# Patient Record
Sex: Female | Born: 1960 | Race: White | Hispanic: No | Marital: Married | Smoking: Never smoker
Health system: Southern US, Community
[De-identification: ages and names within clinical notes are randomized; demographics above are authoritative.]

## PROBLEM LIST (undated history)

## (undated) DIAGNOSIS — I10 Essential (primary) hypertension: Secondary | ICD-10-CM

## (undated) DIAGNOSIS — K219 Gastro-esophageal reflux disease without esophagitis: Secondary | ICD-10-CM

## (undated) DIAGNOSIS — M1990 Unspecified osteoarthritis, unspecified site: Secondary | ICD-10-CM

## (undated) HISTORY — PX: CHOLECYSTECTOMY: SHX55

---

## 2005-02-06 ENCOUNTER — Ambulatory Visit: Payer: Self-pay | Admitting: Unknown Physician Specialty

## 2008-03-05 ENCOUNTER — Emergency Department: Payer: Self-pay | Admitting: Internal Medicine

## 2008-12-14 ENCOUNTER — Ambulatory Visit: Payer: Self-pay | Admitting: Family Medicine

## 2009-09-28 ENCOUNTER — Ambulatory Visit: Payer: Self-pay | Admitting: Unknown Physician Specialty

## 2009-12-10 ENCOUNTER — Emergency Department: Payer: Self-pay | Admitting: Emergency Medicine

## 2012-05-30 ENCOUNTER — Ambulatory Visit: Payer: Self-pay | Admitting: Cardiology

## 2014-01-19 DIAGNOSIS — F419 Anxiety disorder, unspecified: Secondary | ICD-10-CM | POA: Insufficient documentation

## 2014-01-19 DIAGNOSIS — F5101 Primary insomnia: Secondary | ICD-10-CM | POA: Insufficient documentation

## 2014-03-17 ENCOUNTER — Ambulatory Visit: Payer: Self-pay | Admitting: Rheumatology

## 2018-03-13 ENCOUNTER — Other Ambulatory Visit: Payer: Self-pay | Admitting: Rheumatology

## 2018-03-13 DIAGNOSIS — R0602 Shortness of breath: Secondary | ICD-10-CM

## 2018-03-13 DIAGNOSIS — R05 Cough: Secondary | ICD-10-CM

## 2018-03-13 DIAGNOSIS — R059 Cough, unspecified: Secondary | ICD-10-CM

## 2018-03-19 ENCOUNTER — Ambulatory Visit
Admission: RE | Admit: 2018-03-19 | Discharge: 2018-03-19 | Disposition: A | Discharge status: Home / Self Care | Payer: BC Managed Care – PPO | Source: Ambulatory Visit | Attending: Rheumatology | Admitting: Rheumatology

## 2018-03-19 DIAGNOSIS — R05 Cough: Secondary | ICD-10-CM | POA: Insufficient documentation

## 2018-03-19 DIAGNOSIS — R0602 Shortness of breath: Secondary | ICD-10-CM | POA: Diagnosis present

## 2018-03-19 DIAGNOSIS — R059 Cough, unspecified: Secondary | ICD-10-CM

## 2018-07-25 ENCOUNTER — Other Ambulatory Visit: Payer: Self-pay | Admitting: Obstetrics and Gynecology

## 2018-12-06 DIAGNOSIS — M8580 Other specified disorders of bone density and structure, unspecified site: Secondary | ICD-10-CM | POA: Insufficient documentation

## 2019-06-08 ENCOUNTER — Other Ambulatory Visit: Payer: Self-pay | Admitting: Family Medicine

## 2019-06-08 DIAGNOSIS — H5310 Unspecified subjective visual disturbances: Secondary | ICD-10-CM

## 2019-06-08 DIAGNOSIS — G4489 Other headache syndrome: Secondary | ICD-10-CM

## 2019-06-08 DIAGNOSIS — I10 Essential (primary) hypertension: Secondary | ICD-10-CM

## 2019-06-17 ENCOUNTER — Other Ambulatory Visit: Payer: Self-pay

## 2019-06-17 ENCOUNTER — Ambulatory Visit
Admission: RE | Admit: 2019-06-17 | Discharge: 2019-06-17 | Disposition: A | Discharge status: Home / Self Care | Payer: BC Managed Care – PPO | Source: Ambulatory Visit | Attending: Family Medicine | Admitting: Family Medicine

## 2019-06-17 DIAGNOSIS — H5310 Unspecified subjective visual disturbances: Secondary | ICD-10-CM | POA: Diagnosis present

## 2019-06-17 DIAGNOSIS — G4489 Other headache syndrome: Secondary | ICD-10-CM | POA: Diagnosis present

## 2019-06-17 DIAGNOSIS — I10 Essential (primary) hypertension: Secondary | ICD-10-CM | POA: Diagnosis present

## 2019-07-20 ENCOUNTER — Ambulatory Visit: Payer: BC Managed Care – PPO | Admitting: Dermatology

## 2019-08-14 ENCOUNTER — Other Ambulatory Visit: Payer: Self-pay | Admitting: Neurology

## 2019-08-14 ENCOUNTER — Other Ambulatory Visit (HOSPITAL_COMMUNITY): Payer: Self-pay | Admitting: Neurology

## 2019-08-14 DIAGNOSIS — R519 Headache, unspecified: Secondary | ICD-10-CM

## 2019-08-28 ENCOUNTER — Ambulatory Visit
Admission: RE | Admit: 2019-08-28 | Discharge: 2019-08-28 | Disposition: A | Discharge status: Home / Self Care | Payer: BC Managed Care – PPO | Source: Ambulatory Visit | Attending: Neurology | Admitting: Neurology

## 2019-08-28 ENCOUNTER — Other Ambulatory Visit: Payer: Self-pay

## 2019-08-28 DIAGNOSIS — R519 Headache, unspecified: Secondary | ICD-10-CM

## 2019-08-28 DIAGNOSIS — Z8673 Personal history of transient ischemic attack (TIA), and cerebral infarction without residual deficits: Secondary | ICD-10-CM | POA: Insufficient documentation

## 2019-08-28 MED ORDER — GADOBUTROL 1 MMOL/ML IV SOLN
6.0000 mL | Freq: Once | INTRAVENOUS | Status: AC | PRN
  Administered 2019-08-28: 6 mL via INTRAVENOUS

## 2019-11-09 ENCOUNTER — Ambulatory Visit: Payer: BC Managed Care – PPO | Admitting: Dermatology

## 2019-11-09 ENCOUNTER — Other Ambulatory Visit: Payer: Self-pay

## 2019-11-09 DIAGNOSIS — Z1283 Encounter for screening for malignant neoplasm of skin: Secondary | ICD-10-CM

## 2019-11-09 DIAGNOSIS — D229 Melanocytic nevi, unspecified: Secondary | ICD-10-CM | POA: Diagnosis not present

## 2019-11-09 DIAGNOSIS — L820 Inflamed seborrheic keratosis: Secondary | ICD-10-CM | POA: Diagnosis not present

## 2019-11-09 DIAGNOSIS — L814 Other melanin hyperpigmentation: Secondary | ICD-10-CM

## 2019-11-09 DIAGNOSIS — L821 Other seborrheic keratosis: Secondary | ICD-10-CM | POA: Diagnosis not present

## 2019-11-09 DIAGNOSIS — L918 Other hypertrophic disorders of the skin: Secondary | ICD-10-CM

## 2019-11-09 DIAGNOSIS — D225 Melanocytic nevi of trunk: Secondary | ICD-10-CM | POA: Diagnosis not present

## 2019-11-09 DIAGNOSIS — L578 Other skin changes due to chronic exposure to nonionizing radiation: Secondary | ICD-10-CM

## 2019-11-09 DIAGNOSIS — D1800 Hemangioma unspecified site: Secondary | ICD-10-CM

## 2019-11-09 NOTE — Patient Instructions (Addendum)
Seborrheic Keratosis  What causes seborrheic keratoses? Seborrheic keratoses are harmless, common skin growths that first appear during adult life.  As time goes by, more growths appear.  Some people may develop a large number of them.  Seborrheic keratoses appear on both covered and uncovered body parts.  They are not caused by sunlight.  The tendency to develop seborrheic keratoses can be inherited.  They vary in color from skin-colored to gray, brown, or even black.  They can be either smooth or have a rough, warty surface.   Seborrheic keratoses are superficial and look as if they were stuck on the skin.  Under the microscope this type of keratosis looks like layers upon layers of skin.  That is why at times the top layer may seem to fall off, but the rest of the growth remains and re-grows.    Treatment Seborrheic keratoses do not need to be treated, but can easily be removed in the office.  Seborrheic keratoses often cause symptoms when they rub on clothing or jewelry.  Lesions can be in the way of shaving.  If they become inflamed, they can cause itching, soreness, or burning.  Removal of a seborrheic keratosis can be accomplished by freezing, burning, or surgery. If any spot bleeds, scabs, or grows rapidly, please return to have it checked, as these can be an indication of a skin cancer.   Cryotherapy Aftercare  Wash gently with soap and water everyday.   Apply Vaseline and Band-Aid daily until healed.  

## 2019-11-09 NOTE — Progress Notes (Signed)
   New Patient Visit  Subjective  Karen Cummings is a 59 y.o. female who presents for the following: Annual Exam (Total body skin exam, no hx of skin ca) and growth (abdomen, growing).  Also new spots on R breast that she is concerned about.  Itchy spot on R buttock.   The following portions of the chart were reviewed this encounter and updated as appropriate:      Review of Systems:  No other skin or systemic complaints except as noted in HPI or Assessment and Plan.  Objective  Well appearing patient in no apparent distress; mood and affect are within normal limits.  A full examination was performed including scalp, head, eyes, ears, nose, lips, neck, chest, axillae, abdomen, back, buttocks, bilateral upper extremities, bilateral lower extremities, hands, feet, fingers, toes, fingernails, and toenails. All findings within normal limits unless otherwise noted below.  Objective  Right breast areola x 3, R lower abdomen x 1, R buttock x 1 (5): Erythematous keratotic or waxy stuck-on papule    Objective  Lower back: Scattered medium dark brown macules ~2.16mm   Assessment & Plan    Seborrheic Keratoses - Stuck-on, waxy, tan-brown papules and plaques  - Discussed benign etiology and prognosis. - Observe - Call for any changes  Inflamed seborrheic keratosis (5) Right breast areola x 3, R lower abdomen x 1, R buttock x 1  Destruction of lesion - Right breast areola x 3, R lower abdomen x 1, R buttock x 1  Destruction method: cryotherapy   Destruction method comment:  Electrodessication Informed consent: discussed and consent obtained   Lesion destroyed using liquid nitrogen: Yes   Region frozen until ice ball extended beyond lesion: Yes   Outcome: patient tolerated procedure well with no complications   Post-procedure details: wound care instructions given   Additional details:  Prior to procedure, discussed risks of blister formation, small wound, skin dyspigmentation, or  rare scar following cryotherapy.  Nevus Lower back  Benign-appearing.  Observation.  Call clinic for new or changing lesions.  Recommend daily use of broad spectrum spf 30+ sunscreen to sun-exposed areas.     Lentigines - Scattered tan macules - Discussed due to sun exposure - Benign, observe - Call for any changes  Hemangiomas - Red papules - Discussed benign nature - Observe - Call for any changes  Skin cancer screening performed today.  Actinic Damage - diffuse scaly erythematous macules with underlying dyspigmentation - Recommend daily broad spectrum sunscreen SPF 30+ to sun-exposed areas, reapply every 2 hours as needed.  - Call for new or changing lesions.  Melanocytic Nevi - Tan-brown and/or pink-flesh-colored symmetric macules and papules - Benign appearing on exam today - Observation - Call clinic for new or changing moles - Recommend daily use of broad spectrum spf 30+ sunscreen to sun-exposed areas.   Acrochordons (Skin Tags) - Fleshy, skin-colored pedunculated papules - Benign appearing.  - Observe. - If desired, they can be removed with an in office procedure that is not covered by insurance. - Please call the clinic if you notice any new or changing lesions.  Return in about 1 year (around 11/08/2020) for TBSE.   I, Othelia Pulling, RMA, am acting as scribe for Brendolyn Patty, MD . Documentation: I have reviewed the above documentation for accuracy and completeness, and I agree with the above.  Brendolyn Patty MD

## 2020-03-30 ENCOUNTER — Other Ambulatory Visit: Payer: Self-pay | Admitting: Rheumatology

## 2020-03-30 DIAGNOSIS — M25511 Pain in right shoulder: Secondary | ICD-10-CM

## 2020-04-06 ENCOUNTER — Ambulatory Visit: Payer: BC Managed Care – PPO

## 2020-04-10 ENCOUNTER — Ambulatory Visit
Admission: RE | Admit: 2020-04-10 | Discharge: 2020-04-10 | Disposition: A | Discharge status: Home / Self Care | Payer: BC Managed Care – PPO | Source: Ambulatory Visit | Attending: Rheumatology | Admitting: Rheumatology

## 2020-04-10 ENCOUNTER — Other Ambulatory Visit: Payer: Self-pay

## 2020-04-10 DIAGNOSIS — M25511 Pain in right shoulder: Secondary | ICD-10-CM | POA: Diagnosis not present

## 2020-05-19 ENCOUNTER — Other Ambulatory Visit: Payer: Self-pay | Admitting: Surgery

## 2020-06-01 ENCOUNTER — Other Ambulatory Visit: Payer: BC Managed Care – PPO

## 2020-06-01 ENCOUNTER — Encounter
Admission: RE | Admit: 2020-06-01 | Discharge: 2020-06-01 | Disposition: A | Discharge status: Home / Self Care | Payer: BC Managed Care – PPO | Source: Ambulatory Visit | Attending: Surgery | Admitting: Surgery

## 2020-06-01 ENCOUNTER — Other Ambulatory Visit: Payer: Self-pay

## 2020-06-01 HISTORY — DX: Essential (primary) hypertension: I10

## 2020-06-01 HISTORY — DX: Unspecified osteoarthritis, unspecified site: M19.90

## 2020-06-01 HISTORY — DX: Gastro-esophageal reflux disease without esophagitis: K21.9

## 2020-06-01 NOTE — Patient Instructions (Signed)
Your procedure is scheduled on: 06/09/20 Report to [facility]. To find out your arrival time please call [phone] between 1PM - 3PM on 06/08/20.  Remember: Instructions that are not followed completely may result in serious medical risk, up to and including death, or upon the discretion of your surgeon and anesthesiologist your surgery may need to be rescheduled.     _X__ 1. Do not eat food after midnight the night before your procedure.                 No gum chewing or hard candies. You may drink clear liquids up to 2 hours                 before you are scheduled to arrive for your surgery- DO not drink clear                 liquids within 2 hours of the start of your surgery.                 Clear Liquids include:  water, apple juice without pulp, clear carbohydrate                 drink such as Clearfast or Gatorade, Black Coffee or Tea (Do not add                 anything to coffee or tea). Diabetics water only  __X__2.  On the morning of surgery brush your teeth with toothpaste and water, you                 may rinse your mouth with mouthwash if you wish.  Do not swallow any              toothpaste of mouthwash.     _X__ 3.  No Alcohol for 24 hours before or after surgery.   _X__ 4.  Do Not Smoke or use e-cigarettes For 24 Hours Prior to Your Surgery.                 Do not use any chewable tobacco products for at least 6 hours prior to                 surgery.  ____  5.  Bring all medications with you on the day of surgery if instructed.   __X__  6.  Notify your doctor if there is any change in your medical condition      (cold, fever, infections).     Do not wear jewelry, make-up, hairpins, clips or nail polish. Do not wear lotions, powders, or perfumes.  Do not shave 48 hours prior to surgery. Men may shave face and neck. Do not bring valuables to the hospital.    [facility] is not responsible for any belongings or  valuables.  Contacts, dentures/partials or body piercings may not be worn into surgery. Bring a case for your contacts, glasses or hearing aids, a denture cup will be supplied. Leave your suitcase in the car. After surgery it may be brought to your room. For patients admitted to the hospital, discharge time is determined by your treatment team.   Patients discharged the day of surgery will not be allowed to drive home.   Please read over the following fact sheets that you were given:   MRSA Information  __X__ Take these medicines the morning of surgery with A SIP OF WATER:  1. citalopram (CELEXA) 10 MG tablet  2. omeprazole (PRILOSEC) 20 MG capsule  3.   4.  5.  6.  ____ Fleet Enema (as directed)   __X__ Use CHG Soap/SAGE wipes as directed  __X__ Use inhalers on the day of surgery  ____ Stop metformin/Janumet/Farxiga 2 days prior to surgery    ____ Take 1/2 of usual insulin dose the night before surgery. No insulin the morning          of surgery.   ____ Stop Blood Thinners Coumadin/Plavix/Xarelto/Pleta/Pradaxa/Eliquis/Effient/Aspirin  on   Or contact your Surgeon, Cardiologist or Medical Doctor regarding  ability to stop your blood thinners  __X__ Stop Anti-inflammatories 7 days before surgery such as Advil, Ibuprofen, Motrin,  BC or Goodies Powder, Naprosyn, Naproxen, Aleve, Aspirin    __X__ Stop all herbal supplements, fish oil or vitamin E until after surgery.    ____ Bring C-Pap to the hospital.

## 2020-06-02 ENCOUNTER — Encounter
Admission: RE | Admit: 2020-06-02 | Discharge: 2020-06-02 | Disposition: A | Discharge status: Home / Self Care | Payer: BC Managed Care – PPO | Source: Ambulatory Visit | Attending: Surgery | Admitting: Surgery

## 2020-06-02 DIAGNOSIS — Z01810 Encounter for preprocedural cardiovascular examination: Secondary | ICD-10-CM | POA: Insufficient documentation

## 2020-06-07 ENCOUNTER — Other Ambulatory Visit
Admission: RE | Admit: 2020-06-07 | Discharge: 2020-06-07 | Disposition: A | Discharge status: Home / Self Care | Payer: BC Managed Care – PPO | Source: Ambulatory Visit | Attending: Surgery | Admitting: Surgery

## 2020-06-07 ENCOUNTER — Other Ambulatory Visit: Payer: Self-pay

## 2020-06-07 DIAGNOSIS — Z20822 Contact with and (suspected) exposure to covid-19: Secondary | ICD-10-CM | POA: Insufficient documentation

## 2020-06-07 DIAGNOSIS — X58XXXA Exposure to other specified factors, initial encounter: Secondary | ICD-10-CM | POA: Diagnosis not present

## 2020-06-07 DIAGNOSIS — Z79899 Other long term (current) drug therapy: Secondary | ICD-10-CM | POA: Diagnosis not present

## 2020-06-07 DIAGNOSIS — Z79890 Hormone replacement therapy (postmenopausal): Secondary | ICD-10-CM | POA: Diagnosis not present

## 2020-06-07 DIAGNOSIS — Z01812 Encounter for preprocedural laboratory examination: Secondary | ICD-10-CM | POA: Insufficient documentation

## 2020-06-07 DIAGNOSIS — S0990XA Unspecified injury of head, initial encounter: Secondary | ICD-10-CM | POA: Diagnosis not present

## 2020-06-07 DIAGNOSIS — M75121 Complete rotator cuff tear or rupture of right shoulder, not specified as traumatic: Secondary | ICD-10-CM | POA: Diagnosis not present

## 2020-06-07 LAB — SARS CORONAVIRUS 2 (TAT 6-24 HRS): SARS Coronavirus 2: NEGATIVE

## 2020-06-08 MED ORDER — LACTATED RINGERS IV SOLN: INTRAVENOUS | Status: DC

## 2020-06-08 MED ORDER — CHLORHEXIDINE GLUCONATE 0.12 % MT SOLN: 15.0000 mL | Freq: Once | OROMUCOSAL | Status: AC

## 2020-06-08 MED ORDER — CEFAZOLIN SODIUM-DEXTROSE 2-4 GM/100ML-% IV SOLN
2.0000 g | INTRAVENOUS | Status: AC
  Administered 2020-06-09: 2 g via INTRAVENOUS

## 2020-06-08 MED ORDER — ORAL CARE MOUTH RINSE: 15.0000 mL | Freq: Once | OROMUCOSAL | Status: AC

## 2020-06-09 ENCOUNTER — Ambulatory Visit: Payer: BC Managed Care – PPO

## 2020-06-09 ENCOUNTER — Ambulatory Visit
Admission: RE | Admit: 2020-06-09 | Discharge: 2020-06-09 | Disposition: A | Discharge status: Home / Self Care | Payer: BC Managed Care – PPO | Attending: Surgery | Admitting: Surgery

## 2020-06-09 ENCOUNTER — Encounter
Admission: RE | Disposition: A | Discharge status: Home / Self Care | Payer: Self-pay | Source: Home / Self Care | Attending: Surgery

## 2020-06-09 ENCOUNTER — Ambulatory Visit: Payer: BC Managed Care – PPO | Admitting: Anesthesiology

## 2020-06-09 ENCOUNTER — Encounter: Payer: Self-pay | Admitting: Surgery

## 2020-06-09 ENCOUNTER — Other Ambulatory Visit: Payer: Self-pay

## 2020-06-09 DIAGNOSIS — Z20822 Contact with and (suspected) exposure to covid-19: Secondary | ICD-10-CM | POA: Insufficient documentation

## 2020-06-09 DIAGNOSIS — M75121 Complete rotator cuff tear or rupture of right shoulder, not specified as traumatic: Secondary | ICD-10-CM | POA: Insufficient documentation

## 2020-06-09 DIAGNOSIS — X58XXXA Exposure to other specified factors, initial encounter: Secondary | ICD-10-CM | POA: Insufficient documentation

## 2020-06-09 DIAGNOSIS — Z79890 Hormone replacement therapy (postmenopausal): Secondary | ICD-10-CM | POA: Insufficient documentation

## 2020-06-09 DIAGNOSIS — Z79899 Other long term (current) drug therapy: Secondary | ICD-10-CM | POA: Insufficient documentation

## 2020-06-09 DIAGNOSIS — S0990XA Unspecified injury of head, initial encounter: Secondary | ICD-10-CM | POA: Insufficient documentation

## 2020-06-09 DIAGNOSIS — M25511 Pain in right shoulder: Secondary | ICD-10-CM

## 2020-06-09 HISTORY — PX: SHOULDER ARTHROSCOPY WITH SUBACROMIAL DECOMPRESSION, ROTATOR CUFF REPAIR AND BICEP TENDON REPAIR: SHX5687

## 2020-06-09 SURGERY — SHOULDER ARTHROSCOPY WITH SUBACROMIAL DECOMPRESSION, ROTATOR CUFF REPAIR AND BICEP TENDON REPAIR
Anesthesia: General | Laterality: Right

## 2020-06-09 MED ORDER — BUPIVACAINE-EPINEPHRINE 0.5% -1:200000 IJ SOLN
INTRAMUSCULAR | Status: DC | PRN
  Administered 2020-06-09: 30 mL

## 2020-06-09 MED ORDER — HYDROCODONE-ACETAMINOPHEN 7.5-325 MG PO TABS: 1.0000 | ORAL_TABLET | Freq: Once | ORAL | Status: DC | PRN

## 2020-06-09 MED ORDER — EPHEDRINE SULFATE 50 MG/ML IJ SOLN
INTRAMUSCULAR | Status: DC | PRN
  Administered 2020-06-09: 10 mg via INTRAVENOUS

## 2020-06-09 MED ORDER — ACETAMINOPHEN 10 MG/ML IV SOLN
INTRAVENOUS | Status: AC
  Filled 2020-06-09: qty 100

## 2020-06-09 MED ORDER — CEFAZOLIN SODIUM-DEXTROSE 2-4 GM/100ML-% IV SOLN
INTRAVENOUS | Status: AC
  Filled 2020-06-09: qty 100

## 2020-06-09 MED ORDER — CHLORHEXIDINE GLUCONATE 0.12 % MT SOLN
OROMUCOSAL | Status: AC
  Administered 2020-06-09: 15 mL via OROMUCOSAL
  Filled 2020-06-09: qty 15

## 2020-06-09 MED ORDER — LIDOCAINE HCL (CARDIAC) PF 100 MG/5ML IV SOSY
PREFILLED_SYRINGE | INTRAVENOUS | Status: DC | PRN
  Administered 2020-06-09: 100 mg via INTRAVENOUS

## 2020-06-09 MED ORDER — FENTANYL CITRATE (PF) 100 MCG/2ML IJ SOLN
INTRAMUSCULAR | Status: AC
  Filled 2020-06-09: qty 2

## 2020-06-09 MED ORDER — MIDAZOLAM HCL 2 MG/2ML IJ SOLN
1.0000 mg | Freq: Once | INTRAMUSCULAR | Status: AC
  Administered 2020-06-09: 1 mg via INTRAVENOUS

## 2020-06-09 MED ORDER — BUPIVACAINE-EPINEPHRINE (PF) 0.5% -1:200000 IJ SOLN
INTRAMUSCULAR | Status: AC
  Filled 2020-06-09: qty 30

## 2020-06-09 MED ORDER — PROPOFOL 500 MG/50ML IV EMUL
INTRAVENOUS | Status: AC
  Filled 2020-06-09: qty 50

## 2020-06-09 MED ORDER — MIDAZOLAM HCL 2 MG/2ML IJ SOLN
INTRAMUSCULAR | Status: DC | PRN
  Administered 2020-06-09 (×2): 1 mg via INTRAVENOUS

## 2020-06-09 MED ORDER — FENTANYL CITRATE (PF) 100 MCG/2ML IJ SOLN: 50.0000 ug | Freq: Once | INTRAMUSCULAR | Status: AC

## 2020-06-09 MED ORDER — BUPIVACAINE LIPOSOME 1.3 % IJ SUSP
INTRAMUSCULAR | Status: DC | PRN
  Administered 2020-06-09: 15 mL via PERINEURAL

## 2020-06-09 MED ORDER — LACTATED RINGERS IV SOLN: INTRAVENOUS | Status: DC | PRN

## 2020-06-09 MED ORDER — SUGAMMADEX SODIUM 500 MG/5ML IV SOLN
INTRAVENOUS | Status: DC | PRN
  Administered 2020-06-09 (×2): 200 mg via INTRAVENOUS

## 2020-06-09 MED ORDER — MIDAZOLAM HCL 2 MG/2ML IJ SOLN
INTRAMUSCULAR | Status: AC
  Filled 2020-06-09: qty 2

## 2020-06-09 MED ORDER — BUPIVACAINE LIPOSOME 1.3 % IJ SUSP
INTRAMUSCULAR | Status: AC
  Filled 2020-06-09: qty 20

## 2020-06-09 MED ORDER — OXYCODONE HCL 5 MG PO TABS: 5.0000 mg | ORAL_TABLET | ORAL | 0 refills | Status: DC | PRN

## 2020-06-09 MED ORDER — FENTANYL CITRATE (PF) 100 MCG/2ML IJ SOLN
INTRAMUSCULAR | Status: DC | PRN
  Administered 2020-06-09 (×2): 50 ug via INTRAVENOUS

## 2020-06-09 MED ORDER — EPHEDRINE 5 MG/ML INJ
INTRAVENOUS | Status: AC
  Filled 2020-06-09: qty 10

## 2020-06-09 MED ORDER — ACETAMINOPHEN 325 MG PO TABS
325.0000 mg | ORAL_TABLET | ORAL | Status: DC | PRN
Start: 2020-06-09 — End: 2020-06-09

## 2020-06-09 MED ORDER — PROMETHAZINE HCL 25 MG/ML IJ SOLN: 6.2500 mg | INTRAMUSCULAR | Status: DC | PRN

## 2020-06-09 MED ORDER — PROPOFOL 10 MG/ML IV BOLUS
INTRAVENOUS | Status: DC | PRN
  Administered 2020-06-09: 180 mg via INTRAVENOUS

## 2020-06-09 MED ORDER — FENTANYL CITRATE (PF) 100 MCG/2ML IJ SOLN
50.0000 ug | Freq: Once | INTRAMUSCULAR | Status: AC
  Administered 2020-06-09: 50 ug via INTRAVENOUS

## 2020-06-09 MED ORDER — PHENYLEPHRINE HCL (PRESSORS) 10 MG/ML IV SOLN
INTRAVENOUS | Status: DC | PRN
  Administered 2020-06-09 (×3): 100 ug via INTRAVENOUS

## 2020-06-09 MED ORDER — BUPIVACAINE HCL (PF) 0.5 % IJ SOLN
INTRAMUSCULAR | Status: AC
  Filled 2020-06-09: qty 10

## 2020-06-09 MED ORDER — DROPERIDOL 2.5 MG/ML IJ SOLN
0.6250 mg | Freq: Once | INTRAMUSCULAR | Status: DC | PRN
  Filled 2020-06-09: qty 2

## 2020-06-09 MED ORDER — ACETAMINOPHEN 10 MG/ML IV SOLN
INTRAVENOUS | Status: DC | PRN
  Administered 2020-06-09: 1000 mg via INTRAVENOUS

## 2020-06-09 MED ORDER — SODIUM CHLORIDE 0.9 % IV SOLN
INTRAVENOUS | Status: DC | PRN
  Administered 2020-06-09: 50 ug/min via INTRAVENOUS

## 2020-06-09 MED ORDER — FENTANYL CITRATE (PF) 100 MCG/2ML IJ SOLN
INTRAMUSCULAR | Status: AC
  Administered 2020-06-09: 50 ug via INTRAVENOUS
  Filled 2020-06-09: qty 2

## 2020-06-09 MED ORDER — ROCURONIUM BROMIDE 100 MG/10ML IV SOLN
INTRAVENOUS | Status: DC | PRN
  Administered 2020-06-09: 60 mg via INTRAVENOUS

## 2020-06-09 MED ORDER — DEXAMETHASONE SODIUM PHOSPHATE 10 MG/ML IJ SOLN
INTRAMUSCULAR | Status: DC | PRN
  Administered 2020-06-09: 4 mg via INTRAVENOUS

## 2020-06-09 MED ORDER — BUPIVACAINE HCL (PF) 0.5 % IJ SOLN
INTRAMUSCULAR | Status: DC | PRN
  Administered 2020-06-09: 5 mL via PERINEURAL

## 2020-06-09 MED ORDER — MIDAZOLAM HCL 2 MG/2ML IJ SOLN: 1.0000 mg | Freq: Once | INTRAMUSCULAR | Status: AC

## 2020-06-09 MED ORDER — ONDANSETRON HCL 4 MG/2ML IJ SOLN
INTRAMUSCULAR | Status: DC | PRN
  Administered 2020-06-09: 4 mg via INTRAVENOUS

## 2020-06-09 MED ORDER — ACETAMINOPHEN 160 MG/5ML PO SOLN
325.0000 mg | ORAL | Status: DC | PRN
  Filled 2020-06-09: qty 20.3

## 2020-06-09 MED ORDER — MIDAZOLAM HCL 2 MG/2ML IJ SOLN
INTRAMUSCULAR | Status: AC
  Administered 2020-06-09: 1 mg via INTRAVENOUS
  Filled 2020-06-09: qty 2

## 2020-06-09 SURGICAL SUPPLY — 52 items
ANCH SUT 2 2.9 2 LD TPR NDL (Anchor) ×3 IMPLANT
ANCH SUT 5.5 KNTLS (Anchor) ×2 IMPLANT
ANCHOR HEALICOIL REGEN 5.5 (Anchor) ×4 IMPLANT
ANCHOR JUGGERKNOT WTAP NDL 2.9 (Anchor) ×6 IMPLANT
ANCHOR QUATTRO KNOTLESS 4.5MM (Anchor) IMPLANT
ANCHOR SUT W/ ORTHOCORD (Anchor) IMPLANT
APL PRP STRL LF DISP 70% ISPRP (MISCELLANEOUS) ×1
BIT DRILL JUGRKNT W/NDL BIT2.9 (DRILL) ×1 IMPLANT
BLADE FULL RADIUS 3.5 (BLADE) ×2 IMPLANT
BUR ACROMIONIZER 4.0 (BURR) ×2 IMPLANT
CANNULA SHAVER 8MMX76MM (CANNULA) ×2 IMPLANT
CHLORAPREP W/TINT 26 (MISCELLANEOUS) ×2 IMPLANT
COVER MAYO STAND REUSABLE (DRAPES) ×2 IMPLANT
COVER WAND RF STERILE (DRAPES) ×2 IMPLANT
DILATOR 5.5 THREADED HEALICOIL (MISCELLANEOUS) ×2 IMPLANT
DRAPE IMP U-DRAPE 54X76 (DRAPES) ×4 IMPLANT
DRILL JUGGERKNOT W/NDL BIT 2.9 (DRILL) ×2
ELECT CAUTERY BLADE 6.4 (BLADE) ×2 IMPLANT
ELECT REM PT RETURN 9FT ADLT (ELECTROSURGICAL) ×2
ELECTRODE REM PT RTRN 9FT ADLT (ELECTROSURGICAL) ×1 IMPLANT
GAUZE SPONGE 4X4 12PLY STRL (GAUZE/BANDAGES/DRESSINGS) ×2 IMPLANT
GAUZE XEROFORM 1X8 LF (GAUZE/BANDAGES/DRESSINGS) ×2 IMPLANT
GLOVE SRG 8 PF TXTR STRL LF DI (GLOVE) ×1 IMPLANT
GLOVE SURG ENC MOIS LTX SZ7.5 (GLOVE) ×4 IMPLANT
GLOVE SURG ENC MOIS LTX SZ8 (GLOVE) ×4 IMPLANT
GLOVE SURG UNDER LTX SZ8 (GLOVE) ×2 IMPLANT
GLOVE SURG UNDER POLY LF SZ8 (GLOVE) ×2
GOWN STRL REUS W/ TWL LRG LVL3 (GOWN DISPOSABLE) ×1 IMPLANT
GOWN STRL REUS W/ TWL XL LVL3 (GOWN DISPOSABLE) ×1 IMPLANT
GOWN STRL REUS W/TWL LRG LVL3 (GOWN DISPOSABLE) ×2
GOWN STRL REUS W/TWL XL LVL3 (GOWN DISPOSABLE) ×2
GRASPER SUT 15 45D LOW PRO (SUTURE) IMPLANT
IV LACTATED RINGER IRRG 3000ML (IV SOLUTION) ×4
IV LR IRRIG 3000ML ARTHROMATIC (IV SOLUTION) ×2 IMPLANT
KIT CANNULA 8X76-LX IN CANNULA (CANNULA) IMPLANT
MANIFOLD NEPTUNE II (INSTRUMENTS) ×4 IMPLANT
MASK FACE SPIDER DISP (MASK) ×2 IMPLANT
MAT ABSORB  FLUID 56X50 GRAY (MISCELLANEOUS) ×1
MAT ABSORB FLUID 56X50 GRAY (MISCELLANEOUS) ×1 IMPLANT
PACK ARTHROSCOPY SHOULDER (MISCELLANEOUS) ×2 IMPLANT
PAD ABD DERMACEA PRESS 5X9 (GAUZE/BANDAGES/DRESSINGS) ×2 IMPLANT
PASSER SUT FIRSTPASS SELF (INSTRUMENTS) IMPLANT
SLING ULTRA II LG (MISCELLANEOUS) ×2 IMPLANT
STAPLER SKIN PROX 35W (STAPLE) ×2 IMPLANT
STRAP SAFETY 5IN WIDE (MISCELLANEOUS) ×2 IMPLANT
SUT ETHIBOND 0 MO6 C/R (SUTURE) ×2 IMPLANT
SUT ULTRABRAID 2 COBRAID 38 (SUTURE) IMPLANT
SUT VIC AB 2-0 CT1 27 (SUTURE) ×4
SUT VIC AB 2-0 CT1 TAPERPNT 27 (SUTURE) ×2 IMPLANT
TAPE MICROFOAM 4IN (TAPE) ×2 IMPLANT
TUBING ARTHRO INFLOW-ONLY STRL (TUBING) ×2 IMPLANT
WAND WEREWOLF FLOW 90D (MISCELLANEOUS) ×2 IMPLANT

## 2020-06-09 NOTE — Discharge Instructions (Addendum)
AMBULATORY SURGERY  DISCHARGE INSTRUCTIONS   1) The drugs that you were given will stay in your system until tomorrow so for the next 24 hours you should not:  A) Drive an automobile B) Make any legal decisions C) Drink any alcoholic beverage   2) You may resume regular meals tomorrow.  Today it is better to start with liquids and gradually work up to solid foods.  You may eat anything you prefer, but it is better to start with liquids, then soup and crackers, and gradually work up to solid foods.   3) Please notify your doctor immediately if you have any unusual bleeding, trouble breathing, redness and pain at the surgery site, drainage, fever, or pain not relieved by medication.    4) Additional Instructions:  Please contact your physician with any problems or Same Day Surgery at [phone], Monday through Friday 6 am to 4 pm, or [facility] number at [phone].Orthopedic discharge instructions: Keep dressing dry and intact.  May shower after dressing changed on post-op day #4 (Monday).  Cover staples with Band-Aids after drying off. Apply ice frequently to shoulder. Take ibuprofen 800 mg TID with meals for 7-10 days, then as necessary. Take oxycodone as prescribed when needed.  May supplement with ES Tylenol if necessary. Keep shoulder immobilizer on at all times except may remove for bathing purposes. Follow-up in 10-14 days or as scheduled.

## 2020-06-09 NOTE — Anesthesia Postprocedure Evaluation (Signed)
Anesthesia Post Note  Patient: Shanieka Blea  Procedure(s) Performed: RIGHT SHOULDER ARTHROSCOPY WITH DEBRIDEMENT, DECOMPRESSION, ROTATOR CUFF REPAIR, AND  BICEPS TENODESIS (Right )  Patient location during evaluation: PACU Anesthesia Type: General Level of consciousness: awake and alert Pain management: pain level controlled Vital Signs Assessment: post-procedure vital signs reviewed and stable Respiratory status: spontaneous breathing and respiratory function stable Cardiovascular status: stable Anesthetic complications: no   No complications documented.   Last Vitals:  Vitals:   06/09/20 1645 06/09/20 1700  BP: 118/70 122/71  Pulse: 79 89  Resp: 18 20  Temp:    SpO2: 94% 92%    Last Pain:  Vitals:   06/09/20 1700  TempSrc:   PainSc: 0-No pain                 Rockland Kotarski K

## 2020-06-09 NOTE — Transfer of Care (Signed)
Immediate Anesthesia Transfer of Care Note  Patient: Karen Cummings  Procedure(s) Performed: RIGHT SHOULDER ARTHROSCOPY WITH DEBRIDEMENT, DECOMPRESSION, ROTATOR CUFF REPAIR, AND  BICEPS TENODESIS (Right )  Patient Location: PACU  Anesthesia Type:General  Level of Consciousness: sedated  Airway & Oxygen Therapy: Patient Spontanous Breathing and Patient connected to face mask oxygen  Post-op Assessment: Report given to RN and Post -op Vital signs reviewed and stable  Post vital signs: Reviewed and stable  Last Vitals:  Vitals Value Taken Time  BP 111/87 06/09/20 1630  Temp 36.2 C 06/09/20 1630  Pulse 70 06/09/20 1631  Resp 16 06/09/20 1631  SpO2 100 % 06/09/20 1631  Vitals shown include unvalidated device data.  Last Pain:  Vitals:   06/09/20 1153  TempSrc: Tympanic  PainSc: 0-No pain      Patients Stated Pain Goal: 0 (58/68/25 7493)  Complications: No complications documented.

## 2020-06-09 NOTE — Addendum Note (Signed)
Addendum  created 06/09/20 1721 by Jelena Malicoat K, MD   Attestation recorded in Intraprocedure, Intraprocedure Attestations deleted, Intraprocedure Attestations filed    

## 2020-06-09 NOTE — Anesthesia Preprocedure Evaluation (Signed)
Anesthesia Evaluation  Patient identified by MRN, date of birth, ID band Patient awake    Reviewed: Allergy & Precautions, H&P , NPO status , reviewed documented beta blocker date and time   Airway Mallampati: II  TM Distance: >3 FB Neck ROM: full    Dental  (+) Caps, Teeth Intact   Pulmonary    Pulmonary exam normal        Cardiovascular hypertension, Normal cardiovascular exam     Neuro/Psych    GI/Hepatic GERD  Controlled,  Endo/Other    Renal/GU      Musculoskeletal  (+) Arthritis ,   Abdominal   Peds  Hematology   Anesthesia Other Findings Past Medical History: No date: Arthritis No date: GERD (gastroesophageal reflux disease) No date: Hypertension Past Surgical History: No date: CESAREAN SECTION     Comment:  x 3 No date: CHOLECYSTECTOMY BMI    Body Mass Index: 26.56 kg/m     Reproductive/Obstetrics                             Anesthesia Physical Anesthesia Plan  ASA: II  Anesthesia Plan: General ETT   Post-op Pain Management:  Regional for Post-op pain   Induction: Intravenous  PONV Risk Score and Plan: 3 and Ondansetron, Treatment may vary due to age or medical condition, Midazolam and Dexamethasone  Airway Management Planned: Oral ETT  Additional Equipment:   Intra-op Plan:   Post-operative Plan: Extubation in OR  Informed Consent: I have reviewed the patients History and Physical, chart, labs and discussed the procedure including the risks, benefits and alternatives for the proposed anesthesia with the patient or authorized representative who has indicated his/her understanding and acceptance.     Dental Advisory Given  Plan Discussed with: CRNA  Anesthesia Plan Comments: (Interscalene block discussed and accepted)        Anesthesia Quick Evaluation

## 2020-06-09 NOTE — Anesthesia Procedure Notes (Signed)
Procedure Name: Intubation Date/Time: 06/09/2020 2:29 PM Performed by: Justus Memory, CRNA Pre-anesthesia Checklist: Patient identified, Patient being monitored, Timeout performed, Emergency Drugs available and Suction available Patient Re-evaluated:Patient Re-evaluated prior to induction Oxygen Delivery Method: Circle system utilized Preoxygenation: Pre-oxygenation with 100% oxygen Induction Type: IV induction Ventilation: Mask ventilation without difficulty Laryngoscope Size: Mac, 3 and McGraph Grade View: Grade I Tube type: Oral Tube size: 7.0 mm Number of attempts: 1 Airway Equipment and Method: Stylet and Video-laryngoscopy Placement Confirmation: ETT inserted through vocal cords under direct vision,  positive ETCO2 and breath sounds checked- equal and bilateral Secured at: 21 cm Tube secured with: Tape Dental Injury: Teeth and Oropharynx as per pre-operative assessment  Difficulty Due To: Difficulty was anticipated and Difficult Airway- due to anterior larynx Future Recommendations: Recommend- induction with short-acting agent, and alternative techniques readily available

## 2020-06-09 NOTE — Op Note (Signed)
06/09/2020  3:55 PM  Patient:   Karen Cummings  Pre-Op Diagnosis:   Impingement/tendinopathy with full-thickness rotator cuff tear and biceps tendinopathy, right shoulder.  Post-Op Diagnosis:   Impingement/tendinopathy with near full-thickness rotator cuff tear labral fraying, and biceps tendinopathy, right shoulder.  Procedure:   Limited arthroscopic debridement, arthroscopic subacromial decompression, mini-open rotator cuff repair, and mini-open biceps tenodesis, right shoulder.  Anesthesia:   General endotracheal with interscalene block using Exparel placed preoperatively by the anesthesiologist.  Surgeon:   Pascal Lux, MD  Assistant:   Cameron Proud, PA-C  Findings:   As above. There was moderate labral fraying anteriorly, superiorly, and posteriorly without frank detachment from the glenoid rim. There was a near full-thickness articular sided bursal surface tear involving the supraspinatus tendon insertional fibers. The remainder of the rotator cuff was in excellent condition. The biceps tendon demonstrated an area of "lip sticking" without partial or full-thickness tearing. There were grade 1-2 chondromalacial changes involving the central portion of the glenoid. The humeral head articular surface was in excellent condition.  Complications:   None  Fluids:   700 cc  Estimated blood loss:   10 cc  Tourniquet time:   None  Drains:   None  Closure:   Staples      Brief clinical note:   The patient is a 60 year old female with a history of gradually worsening right shoulder pain. The patient's symptoms have progressed despite medications, activity modification, etc. The patient's history and examination are consistent with impingement/tendinopathy with a rotator cuff tear. These findings were confirmed by MRI scan. The patient presents at this time for definitive management of these shoulder symptoms.  Procedure:   The patient underwent placement of an interscalene block  using Exparel by the anesthesiologist in the preoperative holding area before being brought into the operating room and lain in the supine position. The patient then underwent general endotracheal intubation and anesthesia before being repositioned in the beach chair position using the beach chair positioner. The right shoulder and upper extremity were prepped with ChloraPrep solution before being draped sterilely. Preoperative antibiotics were administered. A timeout was performed to confirm the proper surgical site before the expected portal sites and incision site were injected with 0.5% Sensorcaine with epinephrine.   A posterior portal was created and the glenohumeral joint thoroughly inspected with the findings as described above. An anterior portal was created using an outside-in technique. The labrum and rotator cuff were further probed, again confirming the above-noted findings. The areas of labral fraying were debrided back to stable margins using the full-radius resector. The ArthroCare wand was inserted and used to release the biceps tendon from its labral anchor. It also was used to obtain hemostasis as well as to "anneal" the labrum superiorly and anteriorly. The instruments were removed from the joint after suctioning the excess fluid.  The camera was repositioned through the posterior portal into the subacromial space. A separate lateral portal was created using an outside-in technique. The 3.5 mm full-radius resector was introduced and used to perform a subtotal bursectomy. The ArthroCare wand was then inserted and used to remove the periosteal tissue off the undersurface of the anterior third of the acromion as well as to recess the coracoacromial ligament from its attachment along the anterior and lateral margins of the acromion. The 4.0 mm acromionizing bur was introduced and used to complete the decompression by removing the undersurface of the anterior third of the acromion. The full radius  resector was reintroduced to  remove any residual bony debris before the ArthroCare wand was reintroduced to obtain hemostasis. The instruments were then removed from the subacromial space after suctioning the excess fluid.  An approximately 4-5 cm incision was made over the anterolateral aspect of the shoulder beginning at the anterolateral corner of the acromion and extending distally in line with the bicipital groove. This incision was carried down through the subcutaneous tissues to expose the deltoid fascia. The raphae between the anterior and middle thirds was identified and this plane developed to provide access into the subacromial space. Additional bursal tissues were debrided sharply using Metzenbaum scissors. The rotator cuff tear was readily identified. The margins were debrided sharply with a #15 blade and the exposed greater tuberosity roughened with a rongeur. The tear was repaired using two Biomet 2.9 mm JuggerKnot anchors. These sutures were then brought back laterally and secured using two [place] knotless RegeneSorb anchors to create a two-layer closure. An apparent watertight closure was obtained.  The bicipital groove was identified by palpation and opened for 1-1.5 cm. The biceps tendon stump was retrieved through this defect. The floor of the bicipital groove was roughened with a curet before another Biomet 2.9 mm JuggerKnot anchor was inserted. Both sets of sutures were passed through the biceps tendon and tied securely to effect the tenodesis. The bicipital sheath was reapproximated using two #0 Ethibond interrupted sutures, incorporating the biceps tendon to further reinforce the tenodesis.  The wound was copiously irrigated with sterile saline solution before the deltoid raphae was reapproximated using 2-0 Vicryl interrupted sutures. The subcutaneous tissues were closed in two layers using 2-0 Vicryl interrupted sutures before the skin was closed using staples. The  portal sites also were closed using staples. A sterile bulky dressing was applied to the shoulder before the arm was placed into a shoulder immobilizer. The patient was then awakened, extubated, and returned to the recovery room in satisfactory condition after tolerating the procedure well.

## 2020-06-09 NOTE — Anesthesia Procedure Notes (Signed)
Anesthesia Regional Block: Interscalene brachial plexus block   Pre-Anesthetic Checklist: ,, timeout performed, Correct Patient, Correct Site, Correct Laterality, Correct Procedure, Correct Position, site marked, Risks and benefits discussed,  Surgical consent,  Pre-op evaluation,  At surgeon's request and post-op pain management  Laterality: Upper and Right  Prep: chloraprep       Needles:  Injection technique: Single-shot  Needle Type: Echogenic Stimulator Needle     Needle Length: 10cm  Needle Gauge: 21   Needle insertion depth: 6 cm   Additional Needles:   Procedures: Doppler guided,,,, ultrasound used (permanent image in chart),,,,  Motor weakness within 8 minutes.  Narrative:  Start time: 06/09/2020 12:41 PM End time: 06/09/2020 12:47 PM Injection made incrementally with aspirations every 5 mL.  Performed by: Personally  Anesthesiologist: Alphonsus Sias, MD  Additional Notes: Functioning IV was confirmed and O2 [place]/monitors were applied. Light sedation administered as required, patient responsive throughout. A 11mm 21ga EchoStim needle was used. Sterile prep and drape,hand hygiene and sterile gloves were used.  Negative aspiration and negative test dose prior to incremental administration of local anesthetic. 1% Lidocaine for skin wheal,  ml. Total LA: 76ml - Exparel 35ml & 0.5% Bupivicaine 62ml. U/S images stored in chart. The patient tolerated the procedure well.

## 2020-06-09 NOTE — H&P (Signed)
History of Present Illness:  Karen Cummings is a 60 y.o. female who presents today as a result of a referral by Dr. Precious Reel for right shoulder pain.   The patient's symptoms began about 9 months ago and developed without any specific cause or injury. She brought this up to her primary care provider and was treated for a muscle strain injury. Because of continued symptoms, she discussed this with her rheumatologist, Dr. Precious Reel, who tried a steroid injection which also provided little if any relief of her symptoms. Therefore, the patient was sent for an MRI scan and referred to me for further evaluation and treatment. The patient describes the symptoms as moderate (patient is active but has had to make modifications or give up activities) and have the quality of being aching, miserable, nagging, stabbing and throbbing. The pain is localized to the lateral arm/shoulder. These symptoms are aggravated with normal daily activities, with sleeping, carrying heavy objects, at higher levels of activity, with overhead activity, reaching behind the back and exercising. She has tried non-steroidal anti-inflammatories (Ibuprofen) with temporary partial relief. She has tried rest with no significant benefit. She has tried several steroid injections by Dr. Precious Reel over the years which had been providing temporary partial relief of her symptoms until these symptoms flared up last summer. She denies any neck pain, nor does she note any numbness or paresthesias down her arm to her hand. This complaint is not work related. She is a sports non-participant.  Shoulder Surgical History:  The patient has had no shoulder surgery in the past.  PMH/PSH/Family History/Social History/Meds/Allergies:  I have reviewed past medical, surgical, social and family history, medications and allergies as documented in the EMR.  Current Outpatient Medications: . citalopram (CELEXA) 10 MG tablet Take 1 tablet (10 mg total) by  mouth once daily 90 tablet 0  . estradioL (ESTRACE) 0.01 % (0.1 mg/gram) vaginal cream Place 2 g vaginally once daily Insert 1/4 applicator twice weekly 30 g 2  . estrogens, esterified,-methyltestosterone (ESTRATEST) 1.25-2.5 mg tablet Take 1 tablet by mouth once daily 90 tablet 1  . folic acid (FOLVITE) 1 MG tablet Take 1 tablet (1 mg total) by mouth once daily 60 tablet 5  . gentamicin (GARAMYCIN) 0.1 % ointment Apply topically 3 (three) times daily as needed 30 g 0  . HUMIRA,CF, PEN 40 mg/0.4 mL pen injector kit INJECT 1 PEN UNDER THE SKIN EVERY 14 DAYS. 6 mL 1  . hydroCHLOROthiazide (HYDRODIURIL) 12.5 MG tablet Take 1 tablet (12.5 mg total) by mouth once daily 90 tablet 1  . IBU 800 mg tablet Take 1 tablet (800 mg total) by mouth every 8 (eight) hours as needed for Pain 180 tablet 0  . magnesium oxide (MAG-OX) 400 mg (241.3 mg magnesium) tablet Take 400 mg by mouth once daily  . methotrexate 25 mg/mL injection Inject 0.5 mLs (12.5 mg total) subcutaneously once a week 10 mL 0  . omeprazole (PRILOSEC) 20 MG DR capsule Take 1 capsule (20 mg total) by mouth once daily as needed 90 capsule 1  . traZODone (DESYREL) 50 MG tablet Take 2.5 tablets (125 mg total) by mouth nightly 225 tablet 1  . triamcinolone (NASACORT AQ) 55 mcg nasal spray Place 2 sprays into both nostrils once daily as needed 3 Inhaler 1  . Compound Medication Estrogens,esterified-methyltestosterone 1.3-2.4 mg tablet (Patient not taking: Reported on 05/02/2020 ) 90 each 3   Allergies: No Known Allergies  Past Medical History:  . Anxiety  . Cervical  disc disease  . Depression  . Essential hypertension 06/08/2019  . GERD (gastroesophageal reflux disease)  . History of frequent urinary tract infections - Followed by Dr. Jacqlyn Larsen  . Insomnia  . Menopausal syndrome  . Mitral valve prolapse  . Rheumatoid arthritis (CMS-HCC) MTX & Humira; Enbrel and [place]- loss of response  . Staph skin infection 10/2016 in her nose   Past Surgical  History:  . CESAREAN SECTION x 3  . CHOLECYSTECTOMY  . COLONOSCOPY 08/22/2011 (Internal hemorrhoids)  . EGD 05/10/1997  . GYNECOLOGIC CRYOSURGERY  . HYSTERECTOMY (With cervix remaining)   Family History:  . Coronary Artery Disease (Blocked arteries around heart) Mother  . Depression Mother  . Coronary Artery Disease (Blocked arteries around heart) Father  . Stroke Father (Father died 2006-paralyzed)  . Thyroid disease Father  . No Known Problems Sister  . No Known Problems Brother  . No Known Problems Brother  . No Known Problems Sister  . Thyroid disease Sister  . Thyroid disease Brother   Social History:   Socioeconomic History:  Marland Kitchen Marital status: Married  Spouse name: Not on file  . Number of children: Not on file  . Years of education: Not on file  . Highest education level: Not on file  Occupational History  . Occupation: Retired Aug 1st but worked for [employer]  . Smoking status: Never Smoker  . Smokeless tobacco: Never Used  Vaping Use  . Vaping Use: Never used  Substance and Sexual Activity  . Alcohol use: Never  Alcohol/week: 0.0 standard drinks  . Drug use: No  . Sexual activity: Yes  Partners: Male  Birth control/protection: Post-menopausal  Comment: Husband  Other Topics Concern  . 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  Physical Exam:  Vitals:  05/02/20 0936  BP: (!) 138/92  Weight: 68.8 kg (151 lb 9.6 oz)  Height: 160 cm (5' 3")  PainSc: 7  PainLoc: Shoulder   General/Constitutional: The patient appears to be  well-nourished, well-developed, and in no acute distress. Neuro/Psych: Normal mood and affect, oriented to person, place and time. Eyes: Non-icteric. Pupils are equal, round, and reactive to light, and exhibit synchronous movement. ENT: Unremarkable. Lymphatic: No palpable adenopathy. Respiratory: Lungs clear to auscultation, Normal chest excursion, No wheezes and Non-labored breathing Cardiovascular: Regular rate and rhythm. No murmurs. and No edema, swelling or tenderness, except as noted in detailed exam. Integumentary: No impressive skin lesions present, except as noted in detailed exam. Musculoskeletal: Unremarkable, except as noted in detailed exam.  Right shoulder exam: SKIN: normal SWELLING: none WARMTH: none LYMPH NODES: no adenopathy palpable CREPITUS: none TENDERNESS: Non-tender ROM (active):  Forward flexion: 150 degrees Abduction: 145 degrees Internal rotation: L1 ROM (passive):  Forward flexion: 160 degrees Abduction: 155 degrees  ER/IR at 90 abd: 90 degrees / 60 degrees  She has mild-moderate pain at the extremes of all motions.  STRENGTH: Forward flexion: 4/5 Abduction: 4/5 External rotation: 4-4+/5 Internal rotation: 4+/5 Pain with RC testing: Mild-moderate pain with resisted forward flexion and abduction  STABILITY: Normal  SPECIAL TESTS: Luan Pulling' test: positive,  moderate Speed's test: positive Capsulitis - pain w/ passive ER: no Crossed arm test: Mildly positive Crank: Not evaluated Anterior apprehension: Negative Posterior apprehension: Not evaluated  She is neurovascularly intact to the right upper extremity.  Imaging:  Shoulder Imaging, MRI: Right Shoulder: MRI Shoulder Cartilage: No cartilage abnormality. MRI Shoulder Rotator Cuff: Full thickness tear of the supraspinatus. Retracted to the humeral head. MRI Shoulder Labrum / Biceps: Biceps tendinopathy. MRI Shoulder Bone: Normal bone.  Both the films and report were reviewed by myself and  discussed with the patient.  Assessment:  1. Rotator cuff tendinitis, right  2. Nontraumatic complete tear of right rotator cuff  3. Injury of tendon of long head of right biceps   Plan:  The treatment options were discussed with the patient. In addition, patient educational materials were provided regarding the diagnosis and treatment options. The patient is quite frustrated by her symptoms and functional limitations, and is ready to consider more aggressive treatment options. Therefore, I have recommended a surgical procedure, specifically a right shoulder arthroscopy with debridement, decompression, rotator cuff repair, and biceps tenodesis. The procedure was discussed with the patient, as were the potential risks (including bleeding, infection, nerve and/or blood vessel injury, persistent or recurrent pain, failure of the repair, progression of arthritis, need for further surgery, blood clots, strokes, heart attacks and/or arhythmias, pneumonia, etc.) and benefits. The patient states her understanding and wishes to proceed. All of the patient's questions and concerns were answered. She can call any time with further concerns. She will follow up post-surgery, routine.   H&P reviewed and patient re-examined. No changes.

## 2020-10-24 ENCOUNTER — Other Ambulatory Visit: Payer: Self-pay | Admitting: Surgery

## 2020-10-26 MED ORDER — CEFAZOLIN SODIUM-DEXTROSE 2-4 GM/100ML-% IV SOLN: 2.0000 g | INTRAVENOUS | Status: DC

## 2020-10-27 ENCOUNTER — Other Ambulatory Visit: Payer: Self-pay

## 2020-10-27 ENCOUNTER — Encounter
Admission: RE | Disposition: A | Discharge status: Home / Self Care | Payer: Self-pay | Source: Home / Self Care | Attending: Surgery

## 2020-10-27 ENCOUNTER — Ambulatory Visit: Payer: BC Managed Care – PPO

## 2020-10-27 ENCOUNTER — Ambulatory Visit
Admission: RE | Admit: 2020-10-27 | Discharge: 2020-10-27 | Disposition: A | Discharge status: Home / Self Care | Payer: BC Managed Care – PPO | Attending: Surgery | Admitting: Surgery

## 2020-10-27 ENCOUNTER — Encounter: Payer: Self-pay | Admitting: Surgery

## 2020-10-27 DIAGNOSIS — Z419 Encounter for procedure for purposes other than remedying health state, unspecified: Secondary | ICD-10-CM

## 2020-10-27 DIAGNOSIS — Z79899 Other long term (current) drug therapy: Secondary | ICD-10-CM | POA: Insufficient documentation

## 2020-10-27 DIAGNOSIS — Z79890 Hormone replacement therapy (postmenopausal): Secondary | ICD-10-CM | POA: Insufficient documentation

## 2020-10-27 DIAGNOSIS — Z9889 Other specified postprocedural states: Secondary | ICD-10-CM | POA: Insufficient documentation

## 2020-10-27 DIAGNOSIS — M7501 Adhesive capsulitis of right shoulder: Secondary | ICD-10-CM | POA: Insufficient documentation

## 2020-10-27 HISTORY — PX: CLOSED MANIPULATION SHOULDER WITH STERIOD INJECTION: SHX5611

## 2020-10-27 SURGERY — CLOSED MANIPULATION SHOULDER WITH STEROID INJECTION
Anesthesia: General | Site: Shoulder | Laterality: Right

## 2020-10-27 MED ORDER — CHLORHEXIDINE GLUCONATE 0.12 % MT SOLN: 15.0000 mL | Freq: Once | OROMUCOSAL | Status: AC

## 2020-10-27 MED ORDER — PROPOFOL 500 MG/50ML IV EMUL
INTRAVENOUS | Status: DC | PRN
  Administered 2020-10-27: 50 mg via INTRAVENOUS

## 2020-10-27 MED ORDER — FENTANYL CITRATE (PF) 100 MCG/2ML IJ SOLN: 25.0000 ug | INTRAMUSCULAR | Status: DC | PRN

## 2020-10-27 MED ORDER — MIDAZOLAM HCL 2 MG/2ML IJ SOLN
INTRAMUSCULAR | Status: AC
  Administered 2020-10-27: 1 mg via INTRAVENOUS
  Filled 2020-10-27: qty 2

## 2020-10-27 MED ORDER — BUPIVACAINE-EPINEPHRINE (PF) 0.25% -1:200000 IJ SOLN
INTRAMUSCULAR | Status: AC
  Filled 2020-10-27: qty 30

## 2020-10-27 MED ORDER — LIDOCAINE HCL (PF) 1 % IJ SOLN
INTRAMUSCULAR | Status: AC
  Filled 2020-10-27: qty 5

## 2020-10-27 MED ORDER — FENTANYL CITRATE (PF) 100 MCG/2ML IJ SOLN: 50.0000 ug | INTRAMUSCULAR | Status: DC | PRN

## 2020-10-27 MED ORDER — OXYCODONE HCL 5 MG/5ML PO SOLN: 5.0000 mg | Freq: Once | ORAL | Status: DC | PRN

## 2020-10-27 MED ORDER — HYDROCODONE-ACETAMINOPHEN 5-325 MG PO TABS: 1.0000 | ORAL_TABLET | ORAL | Status: DC | PRN

## 2020-10-27 MED ORDER — OXYCODONE HCL 5 MG PO TABS: 5.0000 mg | ORAL_TABLET | Freq: Once | ORAL | Status: DC | PRN

## 2020-10-27 MED ORDER — TRIAMCINOLONE ACETONIDE 40 MG/ML IJ SUSP
INTRAMUSCULAR | Status: AC
  Filled 2020-10-27: qty 1

## 2020-10-27 MED ORDER — PROPOFOL 10 MG/ML IV BOLUS
INTRAVENOUS | Status: AC
  Filled 2020-10-27: qty 40

## 2020-10-27 MED ORDER — BUPIVACAINE HCL (PF) 0.5 % IJ SOLN
INTRAMUSCULAR | Status: DC | PRN
  Administered 2020-10-27 (×2): 10 mL via PERINEURAL

## 2020-10-27 MED ORDER — FENTANYL CITRATE (PF) 100 MCG/2ML IJ SOLN
INTRAMUSCULAR | Status: AC
  Filled 2020-10-27: qty 2

## 2020-10-27 MED ORDER — MIDAZOLAM HCL 2 MG/2ML IJ SOLN: 1.0000 mg | INTRAMUSCULAR | Status: DC | PRN

## 2020-10-27 MED ORDER — CHLORHEXIDINE GLUCONATE 0.12 % MT SOLN
OROMUCOSAL | Status: AC
  Administered 2020-10-27: 15 mL via OROMUCOSAL
  Filled 2020-10-27: qty 15

## 2020-10-27 MED ORDER — METOCLOPRAMIDE HCL 5 MG/ML IJ SOLN: 5.0000 mg | Freq: Three times a day (TID) | INTRAMUSCULAR | Status: DC | PRN

## 2020-10-27 MED ORDER — ONDANSETRON HCL 4 MG PO TABS: 4.0000 mg | ORAL_TABLET | Freq: Four times a day (QID) | ORAL | Status: DC | PRN

## 2020-10-27 MED ORDER — FENTANYL CITRATE (PF) 100 MCG/2ML IJ SOLN
INTRAMUSCULAR | Status: AC
  Administered 2020-10-27: 50 ug via INTRAVENOUS
  Filled 2020-10-27: qty 2

## 2020-10-27 MED ORDER — LIDOCAINE HCL (CARDIAC) PF 100 MG/5ML IV SOSY
PREFILLED_SYRINGE | INTRAVENOUS | Status: DC | PRN
  Administered 2020-10-27: 50 mg via INTRAVENOUS

## 2020-10-27 MED ORDER — TRIAMCINOLONE ACETONIDE 40 MG/ML IJ SUSP
INTRAMUSCULAR | Status: DC | PRN
  Administered 2020-10-27: 40 mg via INTRAMUSCULAR

## 2020-10-27 MED ORDER — BUPIVACAINE-EPINEPHRINE (PF) 0.25% -1:200000 IJ SOLN
INTRAMUSCULAR | Status: DC | PRN
  Administered 2020-10-27: 9 mL via PERINEURAL

## 2020-10-27 MED ORDER — ONDANSETRON HCL 4 MG/2ML IJ SOLN: 4.0000 mg | Freq: Four times a day (QID) | INTRAMUSCULAR | Status: DC | PRN

## 2020-10-27 MED ORDER — ORAL CARE MOUTH RINSE: 15.0000 mL | Freq: Once | OROMUCOSAL | Status: AC

## 2020-10-27 MED ORDER — MIDAZOLAM HCL 2 MG/2ML IJ SOLN
INTRAMUSCULAR | Status: DC | PRN
  Administered 2020-10-27 (×2): 1 mg via INTRAVENOUS

## 2020-10-27 MED ORDER — LIDOCAINE HCL (PF) 2 % IJ SOLN
INTRAMUSCULAR | Status: AC
  Filled 2020-10-27: qty 5

## 2020-10-27 MED ORDER — POTASSIUM CHLORIDE IN NACL 20-0.9 MEQ/L-% IV SOLN
INTRAVENOUS | Status: DC
  Filled 2020-10-27 (×5): qty 1000

## 2020-10-27 MED ORDER — METOCLOPRAMIDE HCL 10 MG PO TABS: 5.0000 mg | ORAL_TABLET | Freq: Three times a day (TID) | ORAL | Status: DC | PRN

## 2020-10-27 MED ORDER — BUPIVACAINE HCL (PF) 0.5 % IJ SOLN
INTRAMUSCULAR | Status: AC
  Filled 2020-10-27: qty 20

## 2020-10-27 MED ORDER — LACTATED RINGERS IV SOLN: INTRAVENOUS | Status: DC

## 2020-10-27 MED ORDER — MIDAZOLAM HCL 2 MG/2ML IJ SOLN
INTRAMUSCULAR | Status: AC
  Filled 2020-10-27: qty 2

## 2020-10-27 SURGICAL SUPPLY — 9 items
BNDG ADH 1X3 SHEER STRL LF (GAUZE/BANDAGES/DRESSINGS) IMPLANT
BNDG ADH THN 3X1 STRL LF (GAUZE/BANDAGES/DRESSINGS)
KIT TURNOVER KIT A (KITS) IMPLANT
MANIFOLD NEPTUNE II (INSTRUMENTS) IMPLANT
NEEDLE HYPO 21X1.5 SAFETY (NEEDLE) ×2 IMPLANT
PAD ALCOHOL SWAB (MISCELLANEOUS) ×4 IMPLANT
SLING ARM LRG DEEP (SOFTGOODS) IMPLANT
SLING ARM M TX990204 (SOFTGOODS) ×2 IMPLANT
SYR 10ML LL (SYRINGE) ×2 IMPLANT

## 2020-10-27 NOTE — Anesthesia Preprocedure Evaluation (Signed)
Anesthesia Evaluation  Patient identified by MRN, date of birth, ID band Patient awake    Reviewed: Allergy & Precautions, NPO status , Patient's Chart, lab work & pertinent test results  History of Anesthesia Complications Negative for: history of anesthetic complications  Airway Mallampati: III  TM Distance: >3 FB Neck ROM: full    Dental  (+) Chipped   Pulmonary neg pulmonary ROS, neg shortness of breath,    Pulmonary exam normal        Cardiovascular Exercise Tolerance: Good hypertension, (-) angina(-) Past MI and (-) DOE Normal cardiovascular exam     Neuro/Psych negative neurological ROS  negative psych ROS   GI/Hepatic Neg liver ROS, GERD  Medicated and Controlled,  Endo/Other  negative endocrine ROS  Renal/GU negative Renal ROS  negative genitourinary   Musculoskeletal   Abdominal   Peds  Hematology negative hematology ROS (+)   Anesthesia Other Findings Past Medical History: No date: Arthritis No date: GERD (gastroesophageal reflux disease) No date: Hypertension  Past Surgical History: No date: CESAREAN SECTION     Comment:  x 3 No date: CHOLECYSTECTOMY 06/09/2020: SHOULDER ARTHROSCOPY WITH SUBACROMIAL DECOMPRESSION,  ROTATOR CUFF REPAIR AND BICEP TENDON REPAIR; Right     Comment:  Procedure: RIGHT SHOULDER ARTHROSCOPY WITH DEBRIDEMENT,               DECOMPRESSION, ROTATOR CUFF REPAIR, AND  BICEPS               TENODESIS;  Surgeon: Corky Mull, MD;  Location: [facility]               ORS;  Service: Orthopedics;  Laterality: Right;  BMI    Body Mass Index: 26.39 kg/m      Reproductive/Obstetrics negative OB ROS                             Anesthesia Physical Anesthesia Plan  ASA: 2  Anesthesia Plan: General   Post-op Pain Management: GA combined w/ Regional for post-op pain   Induction: Intravenous  PONV Risk Score and Plan: Propofol infusion and TIVA  Airway  Management Planned: Natural Airway and Nasal Cannula  Additional Equipment:   Intra-op Plan:   Post-operative Plan:   Informed Consent: I have reviewed the patients History and Physical, chart, labs and discussed the procedure including the risks, benefits and alternatives for the proposed anesthesia with the patient or authorized representative who has indicated his/her understanding and acceptance.     Dental Advisory Given  Plan Discussed with: Anesthesiologist, CRNA and Surgeon  Anesthesia Plan Comments: (Patient consented for risks of anesthesia including but not limited to:  - adverse reactions to medications - risk of airway placement if required - damage to eyes, teeth, lips or other oral mucosa - nerve damage due to positioning  - sore throat or hoarseness - Damage to heart, brain, nerves, lungs, other parts of body or loss of life  Patient voiced understanding.)        Anesthesia Quick Evaluation

## 2020-10-27 NOTE — Op Note (Signed)
10/27/2020  2:56 PM  Patient:   Karen Cummings  Pre-Op Diagnosis:   Secondary adhesive capsulitis, right shoulder.  Post-Op Diagnosis:   Same  Procedure:   Manipulation under anesthesia with steroid injection, right shoulder.  Surgeon:   Pascal Lux, MD  Assistant:   Ardelle Park, PA-S  Anesthesia:   IV sedation with interscalene block placed preoperatively by anesthesiologist.  Findings:   As above. Prior to manipulation, the right shoulder could be forward flexed to 150 and abducted to 145. At 90 of abduction, the shoulder could be externally rotated to 70 and internally rotated to 50. Following manipulation, the shoulder could be forward flexed to 175, abducted to 170 and, at 90 of abduction, externally rotated to 90 and internally rotated to 75.  Complications:   None  EBL:   0 cc  Fluids:   100 cc crystalloid  TT:   None  Drains:   None  Closure:   None  Brief Clinical Note:   The patient is a 60 year old female who is now 4.5 months status post a right shoulder arthroscopy with debridement, decompression, rotator cuff repair, and biceps tenodesis. Despite extensive physical therapy, the patient continues to have difficulty regaining full shoulder range of motion. The patient's history and examination are consistent with adhesive capsulitis. The patient presents at this time for a manipulation under anesthesia with steroid injection of the right shoulder.  Procedure:   The patient underwent placement of an interscalene block in the preoperative holding area before being brought into the operating room and lain in the supine position. After adequate IV sedation was achieved, a timeout was performed to verify the correct surgical site. The right shoulder was gently manipulated in both abduction and external rotation, as well as adduction and internal rotation. Several palpable and audible pops were heard as the scar tissue released, permitting full range of  motion of the shoulder. The glenohumeral joint was injected sterilely using 1 cc of Kenalog-40 and 9 cc of 0.25% Sensorcaine with epinephrine before the patient was placed into a sling. The patient was then awakened and returned to the recovery room in satisfactory condition after tolerating the procedure well.

## 2020-10-27 NOTE — H&P (Signed)
History of Present Illness:  Karen Cummings is a 60 y.o. female who presents for follow-up now 4.5 months status post a limited arthroscopic debridement, arthroscopic subacromial decompression, mini-open rotator cuff repair, and mini-open biceps tenodesis of the right shoulder. Overall, the patient feels that she is doing reasonably well and that she has no pain in her shoulder and is no longer taking any medications for discomfort. Her primary concern is that she is still not able to reach behind her back or to hook her bra. She has been attending physical therapy, as well as performing exercises on her own at home, and feels that her progress regarding her range of motion has plateaued, although she continues to see improvement in her strength. She denies any reinjury to the shoulder, and denies any fevers or chills. She is sleeping well at night.  Current Outpatient Medications:  acetaminophen (TYLENOL) 500 MG tablet Take 500 mg by mouth as needed for Pain   albuterol 90 mcg/actuation inhaler Inhale 2 inhalations into the lungs every 6 (six) hours as needed for Wheezing 1 each 1   cholecalciferol (VITAMIN D3) 1000 unit tablet Take by mouth Take 1,000 Units by mouth daily.   citalopram (CELEXA) 10 MG tablet Take 1 tablet (10 mg total) by mouth once daily 90 tablet 0   estradioL (ESTRACE) 0.01 % (0.1 mg/gram) vaginal cream Place 2 g vaginally once daily Insert 1/4 applicator twice weekly 30 g 2   estradioL (ESTRACE) 1 MG tablet Take 1 tablet (1 mg total) by mouth once daily 30 tablet 11   folic acid (FOLVITE) 1 MG tablet Take 1 tablet (1 mg total) by mouth once daily 60 tablet 5   gentamicin (GARAMYCIN) 0.1 % ointment Apply topically 3 (three) times daily as needed 30 g 0   HUMIRA,CF, PEN 40 mg/0.4 mL pen injector kit INJECT 1 PEN UNDER THE SKIN EVERY 14 DAYS. 6 mL 1   hydroCHLOROthiazide (HYDRODIURIL) 12.5 MG tablet Take 1 tablet (12.5 mg total) by mouth once daily 90 tablet 1   IBU 800 mg tablet Take  1 tablet (800 mg total) by mouth every 8 (eight) hours as needed for Pain 180 tablet 0   methotrexate 25 mg/mL injection Inject 0.5 mLs (12.5 mg total) subcutaneously once a week 10 mL 0   omeprazole (PRILOSEC) 20 MG DR capsule Take 1 capsule (20 mg total) by mouth once daily as needed 90 capsule 1   traZODone (DESYREL) 50 MG tablet Take 2.5 tablets (125 mg total) by mouth nightly 225 tablet 1   triamcinolone (NASACORT AQ) 55 mcg nasal spray Place 2 sprays into both nostrils once daily as needed 3 Inhaler 1   Allergies: No Known Allergies  Past Medical History:   Anxiety   Cervical disc disease   Depression   Essential hypertension 06/08/2019   GERD (gastroesophageal reflux disease)   History of frequent urinary tract infections (Followed by Dr. Cope)   Insomnia   Menopausal syndrome   Mitral valve prolapse   Rheumatoid arthritis (CMS-HCC) - MTX & Humira; Enbrel and [place]- loss of response   Staph skin infection 10/2016 in her nose   Past Surgical History:   Limited arthroscopic debridement, arthroscopic subacromial decompression, mini-open rotator cuff repair, and mini-open biceps tenodesis, right shoulder Right 06/09/2020 (Dr. Poggi)   CESAREAN SECTION x 3   CHOLECYSTECTOMY   COLONOSCOPY 08/22/2011 (Internal hemorrhoids)   EGD 05/10/1997   GYNECOLOGIC CRYOSURGERY   HYSTERECTOMY (With cervix remaining)   Family History:   Coronary   Artery Disease (Blocked arteries around heart) Mother   Depression Mother   Coronary Artery Disease (Blocked arteries around heart) Father   Stroke Father (Father died 2006-paralyzed)   Thyroid disease Father   No Known Problems Sister   No Known Problems Brother   No Known Problems Brother   No Known Problems Sister   Thyroid disease Sister   Thyroid disease Brother   Social History:   Socioeconomic History:   Marital status: Married  Occupational History   Occupation: Retired Aug 1st but worked for [employer]  Tobacco Use   Smoking status:  Never Smoker   Smokeless tobacco: Never Used  Vaping Use   Vaping Use: Never used  Substance and Sexual Activity   Alcohol use: Never  Alcohol/week: 0.0 standard drinks   Drug use: No   Sexual activity: Yes  Partners: Male  Birth control/protection: Post-menopausal  Comment: Husband  Social History Narrative  Marital Status- Married  Lives with husband  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  Physical Exam: Vitals:  10/24/20 1014  BP: 120/68  Weight: 70 kg (154 lb 6.4 oz)  Height: 160 cm (5' 3")  PainSc: 3  PainLoc: Shoulder   General/Constitutional: The patient appears to be well-nourished, well-developed, and in no acute distress. Neuro/Psych: Normal mood and affect, oriented to person, place and time.  Eyes: Non-icteric. Pupils are equal, round, and reactive to light, and exhibit synchronous movement. ENT: Unremarkable. Lymphatic: No palpable adenopathy. Respiratory: Lungs clear to auscultation, Normal chest excursion, No wheezes and Non-labored breathing Cardiovascular: Regular rate and rhythm. No murmurs. and No edema, swelling or tenderness, except as noted in detailed exam. Integumentary: No impressive skin lesions present, except as noted in detailed exam. Musculoskeletal: Unremarkable, except as noted in detailed exam.  Right shoulder exam: On examination, her surgical incision and arthroscopic portal sites remain well-healed and without evidence for infection.  No swelling, erythema, ecchymosis, abrasions, or other skin abnormalities are identified. There is no longer any tenderness to palpation over the anterior or lateral aspects of the shoulder. Actively, she is able to forward flex to 155 degrees, abduct to 150 degrees, and internally rotate to her right PSIS.  Passively, she  can tolerate forward flexion to 160 degrees and abduction to 155 degrees.  At 90 degrees of abduction, she can tolerate external rotation to 80 degrees and internal rotation to 65 degrees.  She describes mild "tightness" at the extremes of all motions.  She exhibits 4-4+/5 strength with resisted forward flexion and abduction as well as with resisted external rotation, and 4+/5 strength with resisted internal and external rotation.  She again is neurovascularly intact to the right upper extremity and hand.  Assessment:  Status post limited arthroscopic debridement, arthroscopic subacromial decompression, mini-open rotator cuff repair, and mini-open biceps tenodesis, right shoulder   Secondary adhesive capsulitis of right shoulder   Plan: The treatment options were discussed with the patient. In addition, patient educational materials were provided regarding the diagnosis and treatment options. The patient is quite frustrated by her residual stiffness and difficulty reaching behind her back. I believe that her symptoms are due to some residual adhesive capsulitis following her surgery. Therefore, I have recommended a surgical procedure, specifically a manipulation under anesthesia with steroid injection. The procedure was discussed with the patient, as were the potential risks (including bleeding,   infection, nerve and/or blood vessel injury, persistent or recurrent pain/stiffness, humerus fracture, need for further surgery, blood clots, strokes, heart attacks and/or arhythmias, pneumonia, etc.) and benefits. The patient states his/her understanding and wishes to proceed. All of the patient's questions and concerns were answered. She can call any time with further concerns. She will follow up post-surgery, routine.   H&P reviewed and patient re-examined. No changes.  

## 2020-10-27 NOTE — Anesthesia Procedure Notes (Signed)
Procedure Name: MAC Date/Time: 10/27/2020 2:40 PM Performed by: Jerrye Noble, CRNA Pre-anesthesia Checklist: Patient identified, Emergency Drugs available, Suction available and Patient being monitored Patient Re-evaluated:Patient Re-evaluated prior to induction Oxygen Delivery Method: Nasal cannula

## 2020-10-27 NOTE — Anesthesia Procedure Notes (Signed)
Anesthesia Regional Block: Interscalene brachial plexus block   Pre-Anesthetic Checklist: , timeout performed,  Correct Patient, Correct Site, Correct Laterality,  Correct Procedure, Correct Position, site marked,  Risks and benefits discussed,  Surgical consent,  Pre-op evaluation,  At surgeon's request and post-op pain management  Laterality: Upper and Right  Prep: chloraprep       Needles:  Injection technique: Single-shot  Needle Type: Stimiplex     Needle Length: 5cm  Needle Gauge: 22     Additional Needles:   Procedures:,,,, ultrasound used (permanent image in chart),,    Narrative:  Start time: 10/27/2020 12:37 PM End time: 10/27/2020 12:39 PM Injection made incrementally with aspirations every 5 mL.  Performed by: Personally  Anesthesiologist: Shunte Senseney, Precious Haws, MD  Additional Notes: Patient consented for risk and benefits of nerve block including but not limited to nerve damage, failed block, bleeding and infection.  Patient voiced understanding.  Functioning IV was confirmed and monitors were applied.  Timeout done prior to procedure and prior to any sedation being given to the patient.  Patient confirmed procedure site prior to any sedation given to the patient.  A 1m 22ga Stimuplex needle was used. Sterile prep,hand hygiene and sterile gloves were used.  Minimal sedation used for procedure.  No paresthesia endorsed by patient during the procedure.  Negative aspiration and negative test dose prior to incremental administration of local anesthetic. The patient tolerated the procedure well with no immediate complications.

## 2020-10-27 NOTE — Transfer of Care (Signed)
Immediate Anesthesia Transfer of Care Note  Patient: Karen Cummings  Procedure(s) Performed: CLOSED MANIPULATION SHOULDER WITH STEROID INJECTION (Right: Shoulder)  Patient Location: PACU  Anesthesia Type:General  Level of Consciousness: awake, drowsy and patient cooperative  Airway & Oxygen Therapy: Patient Spontanous Breathing  Post-op Assessment: Report given to RN and Post -op Vital signs reviewed and stable  Post vital signs: Reviewed and stable  Last Vitals:  Vitals Value Taken Time  BP 112/64 10/27/20 1500  Temp 36.2 C 10/27/20 1456  Pulse 65 10/27/20 1503  Resp 19 10/27/20 1503  SpO2 96 % 10/27/20 1503  Vitals shown include unvalidated device data.  Last Pain:  Vitals:   10/27/20 1116  TempSrc: Temporal         Complications: No notable events documented.

## 2020-10-27 NOTE — Discharge Instructions (Addendum)
Orthopedic discharge instructions: May shower once nerve block wears off. Wear sling until nerve block wears off. Apply ice frequently to shoulder. Take ibuprofen 600-800 mg TID with meals for 5-7 days, then as necessary. Take pain medication as prescribed when needed.  May supplement with ES Tylenol if necessary. Start physical therapy tomorrow as scheduled. Follow-up in 10-14 days or as scheduled.AMBULATORY SURGERY  DISCHARGE INSTRUCTIONS   The drugs that you were given will stay in your system until tomorrow so for the next 24 hours you should not:  Drive an automobile Make any legal decisions Drink any alcoholic beverage   You may resume regular meals tomorrow.  Today it is better to start with liquids and gradually work up to solid foods.  You may eat anything you prefer, but it is better to start with liquids, then soup and crackers, and gradually work up to solid foods.   Please notify your doctor immediately if you have any unusual bleeding, trouble breathing, redness and pain at the surgery site, drainage, fever, or pain not relieved by medication.    Additional Instructions:        Please contact your physician with any problems or Same Day Surgery at [phone], Monday through Friday 6 am to 4 pm, or [place] at [facility] number at [phone].

## 2020-10-28 ENCOUNTER — Encounter: Payer: Self-pay | Admitting: Surgery

## 2020-10-28 NOTE — Anesthesia Postprocedure Evaluation (Signed)
Anesthesia Post Note  Patient: Karen Cummings  Procedure(s) Performed: CLOSED MANIPULATION SHOULDER WITH STEROID INJECTION (Right: Shoulder)  Patient location during evaluation: PACU Anesthesia Type: General Level of consciousness: awake and alert, awake and oriented Pain management: pain level controlled Vital Signs Assessment: post-procedure vital signs reviewed and stable Respiratory status: spontaneous breathing, nonlabored ventilation and respiratory function stable Cardiovascular status: blood pressure returned to baseline and stable Postop Assessment: no apparent nausea or vomiting Anesthetic complications: no   No notable events documented.   Last Vitals:  Vitals:   10/27/20 1605 10/27/20 1614  BP:  133/80  Pulse: 71 60  Resp: (!) 21 18  Temp:    SpO2: 99%     Last Pain:  Vitals:   10/27/20 1614  TempSrc:   PainSc: 0-No pain                 Phill Mutter

## 2020-11-07 ENCOUNTER — Other Ambulatory Visit: Payer: Self-pay

## 2020-11-07 ENCOUNTER — Ambulatory Visit (INDEPENDENT_AMBULATORY_CARE_PROVIDER_SITE_OTHER): Payer: BC Managed Care – PPO | Admitting: Dermatology

## 2020-11-07 DIAGNOSIS — L814 Other melanin hyperpigmentation: Secondary | ICD-10-CM

## 2020-11-07 DIAGNOSIS — L820 Inflamed seborrheic keratosis: Secondary | ICD-10-CM

## 2020-11-07 DIAGNOSIS — L578 Other skin changes due to chronic exposure to nonionizing radiation: Secondary | ICD-10-CM | POA: Diagnosis not present

## 2020-11-07 DIAGNOSIS — D492 Neoplasm of unspecified behavior of bone, soft tissue, and skin: Secondary | ICD-10-CM

## 2020-11-07 DIAGNOSIS — D225 Melanocytic nevi of trunk: Secondary | ICD-10-CM

## 2020-11-07 DIAGNOSIS — D229 Melanocytic nevi, unspecified: Secondary | ICD-10-CM

## 2020-11-07 DIAGNOSIS — L821 Other seborrheic keratosis: Secondary | ICD-10-CM | POA: Diagnosis not present

## 2020-11-07 DIAGNOSIS — Z1283 Encounter for screening for malignant neoplasm of skin: Secondary | ICD-10-CM

## 2020-11-07 DIAGNOSIS — D1800 Hemangioma unspecified site: Secondary | ICD-10-CM

## 2020-11-07 HISTORY — DX: Melanocytic nevi, unspecified: D22.9

## 2020-11-07 NOTE — Patient Instructions (Addendum)
If you have any questions or concerns for your doctor, please call our main line at [phone] and press option 4 to reach your doctor's medical assistant. If no one answers, please leave a voicemail as directed and we will return your call as soon as possible. Messages left after 4 pm will be answered the following business day.   You may also send us a message via MyChart. We typically respond to MyChart messages within 1-2 business days.  For prescription refills, please ask your pharmacy to contact our office. Our fax number is [phone].  If you have an urgent issue when the clinic is closed that cannot wait until the next business day, you can page your doctor at the number below.    Please note that while we do our best to be available for urgent issues outside of office hours, we are not available 24/7.   If you have an urgent issue and are unable to reach us, you may choose to seek medical care at your doctor's office, retail clinic, urgent care center, or emergency room.  If you have a medical emergency, please immediately call 911 or go to the emergency department.  Pager Numbers  - Dr. Kowalski: [phone]  - Dr. Moye: [phone]  - Dr. Stewart: [phone]  In the event of inclement weather, please call our main line at [phone] for an update on the status of any delays or closures.  Dermatology Medication Tips: Please keep the boxes that topical medications come in in order to help keep track of the instructions about where and how to use these. Pharmacies typically print the medication instructions only on the boxes and not directly on the medication tubes.   If your medication is too expensive, please contact our office at [phone] option 4 or send us a message through MyChart.   We are unable to tell what your co-pay for medications will be in advance as this is different depending on your insurance coverage. However, we may be able to find a substitute  medication at lower cost or fill out paperwork to get insurance to cover a needed medication.   If a prior authorization is required to get your medication covered by your insurance company, please allow us 1-2 business days to complete this process.  Drug prices often vary depending on where the prescription is filled and some pharmacies may offer cheaper prices.  The website www.goodrx.com contains coupons for medications through different pharmacies. The prices here do not account for what the cost may be with help from insurance (it may be cheaper with your insurance), but the website can give you the price if you did not use any insurance.  - You can print the associated coupon and take it with your prescription to the pharmacy.  - You may also stop by our office during regular business hours and pick up a GoodRx coupon card.  - If you need your prescription sent electronically to a different pharmacy, notify our office through [place] MyChart or by phone at [phone] option 4.   Wound Care Instructions  Cleanse wound gently with soap and water once a day then pat dry with clean gauze. Apply a thing coat of Petrolatum (petroleum jelly, "Vaseline") over the wound (unless you have an allergy to this). We recommend that you use a new, sterile tube of Vaseline. Do not pick or remove scabs. Do not remove the yellow or white "healing tissue" from the base of the wound.  Cover the   wound with fresh, clean, nonstick gauze and secure with paper tape. You may use Band-Aids in place of gauze and tape if the would is small enough, but would recommend trimming much of the tape off as there is often too much. Sometimes Band-Aids can irritate the skin.  You should call the office for your biopsy report after 1 week if you have not already been contacted.  If you experience any problems, such as abnormal amounts of bleeding, swelling, significant bruising, significant pain, or evidence of infection,  please call the office immediately.  FOR ADULT SURGERY PATIENTS: If you need something for pain relief you may take 1 extra strength Tylenol (acetaminophen) AND 2 Ibuprofen ('200mg'$  each) together every 4 hours as needed for pain. (do not take these if you are allergic to them or if you have a reason you should not take them.) Typically, you may only need pain medication for 1 to 3 days.   Cryotherapy Aftercare  Wash gently with soap and water everyday.   Apply Vaseline and Band-Aid daily until healed.

## 2020-11-07 NOTE — Progress Notes (Signed)
Follow-Up Visit   Subjective  Karen Cummings is a 60 y.o. female who presents for the following: Total body skin exam. Spot on R breast itches and rubs on bra.  No other concerns.  No h/o skin cancer.   The following portions of the chart were reviewed this encounter and updated as appropriate:       Review of Systems:  No other skin or systemic complaints except as noted in HPI or Assessment and Plan.  Objective  Well appearing patient in no apparent distress; mood and affect are within normal limits.  A full examination was performed including scalp, head, eyes, ears, nose, lips, neck, chest, axillae, abdomen, back, buttocks, bilateral upper extremities, bilateral lower extremities, hands, feet, fingers, toes, fingernails, and toenails. All findings within normal limits unless otherwise noted below.  R lat breast x 1, Total = 1 Erythematous keratotic or waxy stuck-on papule  R buttock 4.35m med dark brown macule       back Scattered small med dark brown macules of the back   Assessment & Plan   Lentigines - Scattered tan macules - Due to sun exposure - Benign-appering, observe - Recommend daily broad spectrum sunscreen SPF 30+ to sun-exposed areas, reapply every 2 hours as needed. - Call for any changes  Seborrheic Keratoses - Stuck-on, waxy, tan-brown papules and/or plaques  - Benign-appearing - Discussed benign etiology and prognosis. - Observe - Call for any changes  Melanocytic Nevi - Tan-brown and/or pink-flesh-colored symmetric macules and papules - Benign appearing on exam today - Observation - Call clinic for new or changing moles - Recommend daily use of broad spectrum spf 30+ sunscreen to sun-exposed areas.   Hemangiomas - Red papules - Discussed benign nature - Observe - Call for any changes  Actinic Damage - Chronic condition, secondary to cumulative UV/sun exposure - diffuse scaly erythematous macules with underlying  dyspigmentation - Recommend daily broad spectrum sunscreen SPF 30+ to sun-exposed areas, reapply every 2 hours as needed.  - Staying in the shade or wearing long sleeves, sun glasses (UVA+UVB protection) and wide brim hats (4-inch brim around the entire circumference of the hat) are also recommended for sun protection.  - Call for new or changing lesions.  Skin cancer screening performed today.  Inflamed seborrheic keratosis R lat breast x 1, Total = 1  Destruction of lesion - R lat breast x 1, Total = 1  Destruction method: cryotherapy   Informed consent: discussed and consent obtained   Lesion destroyed using liquid nitrogen: Yes   Region frozen until ice ball extended beyond lesion: Yes   Outcome: patient tolerated procedure well with no complications   Post-procedure details: wound care instructions given   Additional details:  Prior to procedure, discussed risks of blister formation, small wound, skin dyspigmentation, or rare scar following cryotherapy. Recommend Vaseline ointment to treated areas while healing.   Neoplasm of skin R buttock  Epidermal / dermal shaving  Lesion diameter (cm):  0.6 Informed consent: discussed and consent obtained   Patient was prepped and draped in usual sterile fashion: area prepped with alcohol. Anesthesia: the lesion was anesthetized in a standard fashion   Anesthetic:  1% lidocaine w/ epinephrine 1-100,000 buffered w/ 8.4% NaHCO3 Instrument used: flexible razor blade   Hemostasis achieved with: pressure, aluminum chloride and electrodesiccation   Outcome: patient tolerated procedure well   Post-procedure details: wound care instructions given   Post-procedure details comment:  Ointment and small bandage applied  Specimen 1 - Surgical  pathology Differential Diagnosis: D48.5 Nevus vs Dysplastic nevus  Check Margins: yes 4.5m med dark brown macule  Nevus back  Benign-appearing.  Observation.  Call clinic for new or changing moles.   Recommend daily use of broad spectrum spf 30+ sunscreen to sun-exposed areas.    Return in about 1 year (around 11/07/2021) for TBSE.  I, SOthelia Pulling RMA, am acting as scribe for TBrendolyn Patty MD .  Documentation: I have reviewed the above documentation for accuracy and completeness, and I agree with the above.  TBrendolyn PattyMD

## 2020-11-15 ENCOUNTER — Telehealth: Payer: Self-pay

## 2020-11-15 NOTE — Telephone Encounter (Signed)
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

## 2020-11-15 NOTE — Telephone Encounter (Signed)
Advised pt of bx results/sh ?

## 2021-10-27 IMAGING — MR MR SHOULDER*R* W/O CM
5 series · 34 of 40 positions shown · non-contrast
Comparison: None.

CLINICAL DATA: Right shoulder pain, limited range of motion.
History of rheumatoid arthritis.

EXAM:
MRI OF THE RIGHT SHOULDER WITHOUT CONTRAST
TECHNIQUE: Multiplanar, multisequence MR imaging of the shoulder was performed.
No intravenous contrast was administered.

[Series 3: T2 fat-sat · axial · right · 4.0mm · 0.44mm/px · z∈[-12,+108]mm · 8 of 26 slices shown (1 of 3)]
[im 1/26]
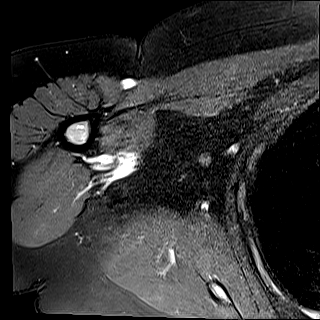
[im 4/26]
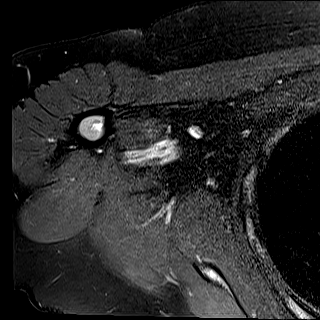
[im 8/26]
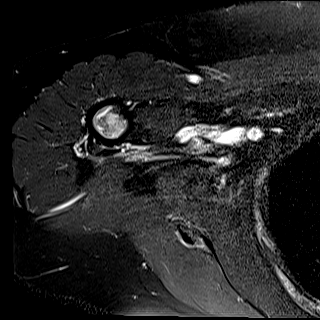
[im 11/26]
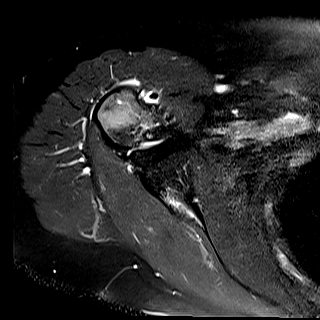
[im 15/26]
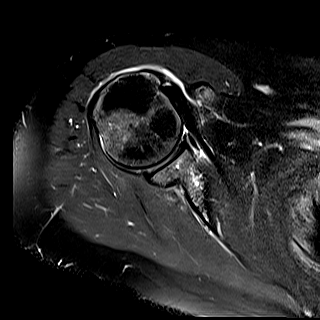
[im 18/26]
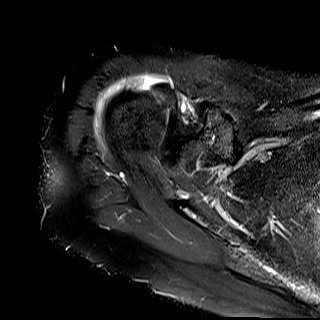
[im 22/26]
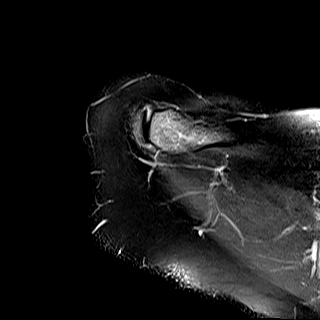
[im 26/26]
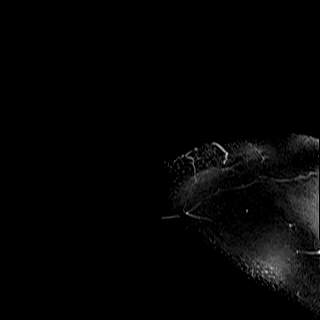

[Series 4: PD · oblique · right · 4.0mm · 0.44mm/px · 8 of 22 slices shown]
[im 1/22]
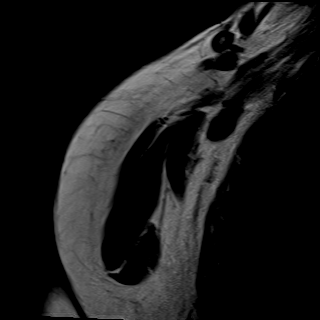
[im 4/22]
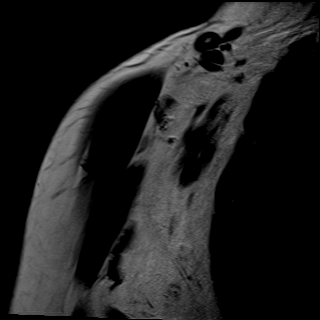
[im 7/22]
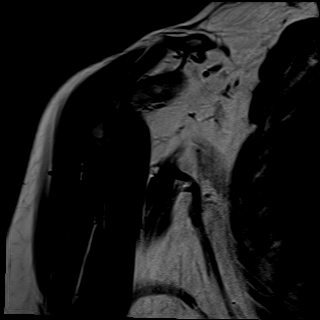
[im 10/22]
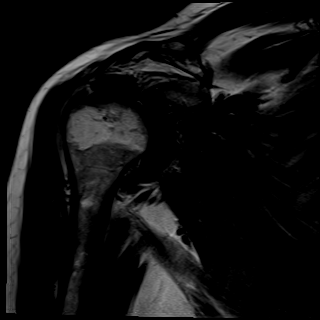
[im 13/22]
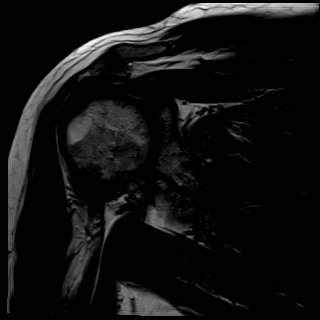
[im 16/22]
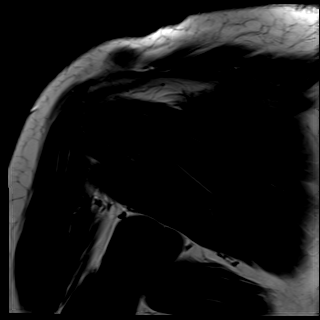
[im 19/22]
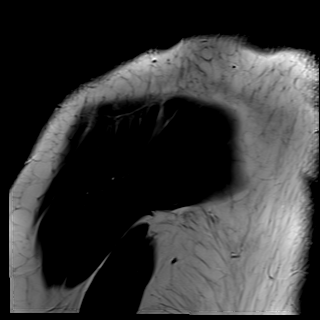
[im 22/22]
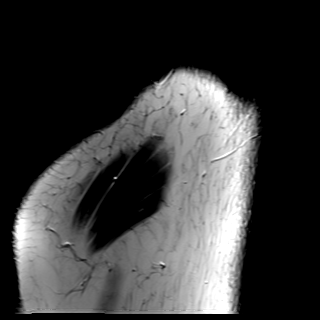

[Series 5: T2 fat-sat · oblique · right · 4.0mm · 0.44mm/px · 8 of 22 slices shown (2 of 3)]
[im 1/22]
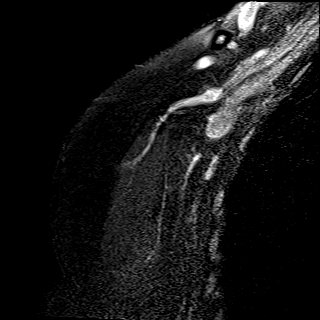
[im 4/22]
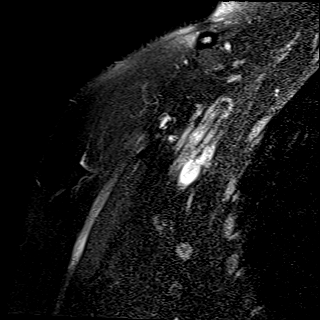
[im 7/22]
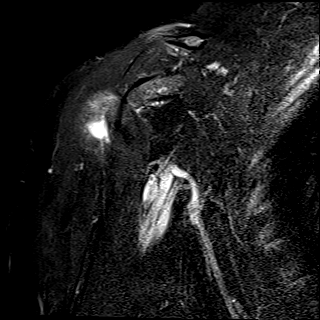
[im 10/22]
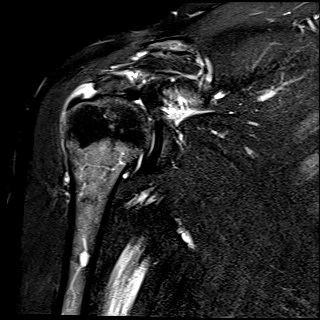
[im 13/22]
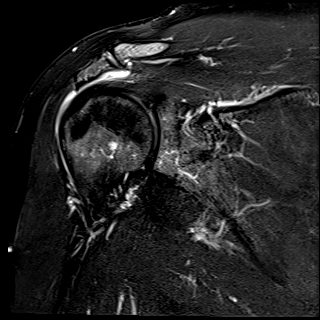
[im 16/22]
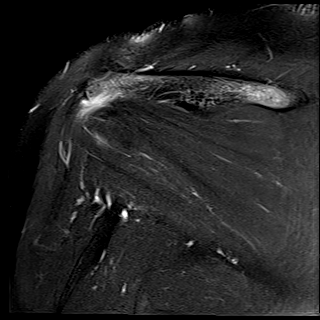
[im 19/22]
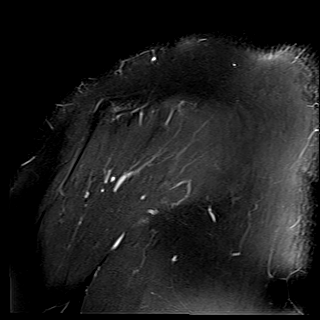
[im 22/22]
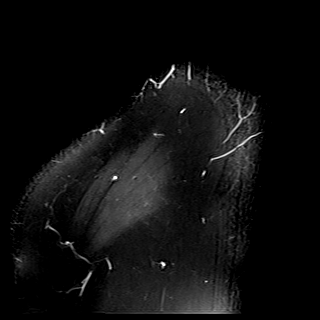

[Series 6: T2 fat-sat · oblique · right · 4.0mm · 0.27mm/px · 8 of 22 slices shown (3 of 3)]
[im 1/22]
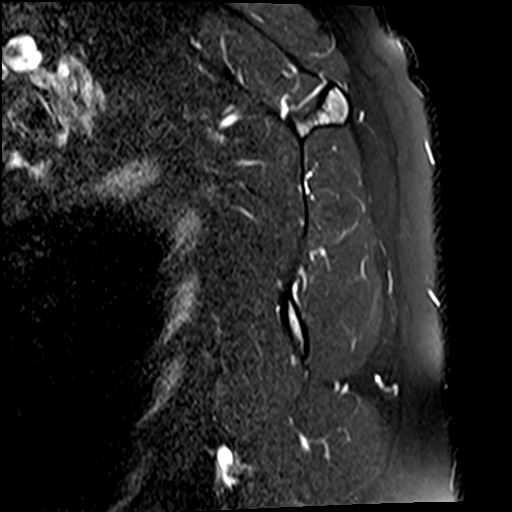
[im 4/22]
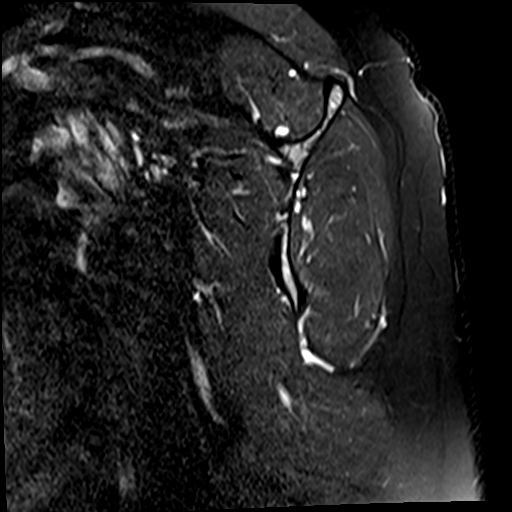
[im 7/22]
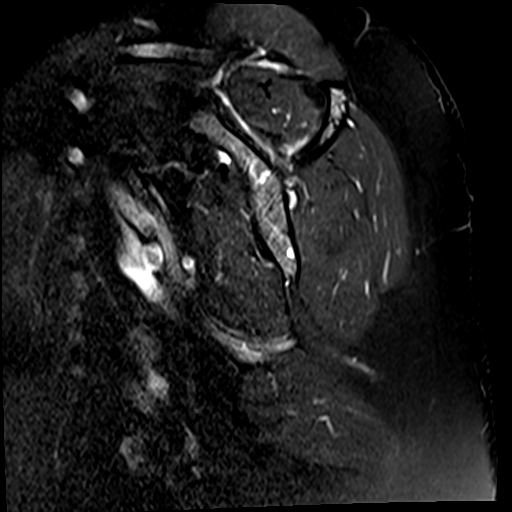
[im 10/22]
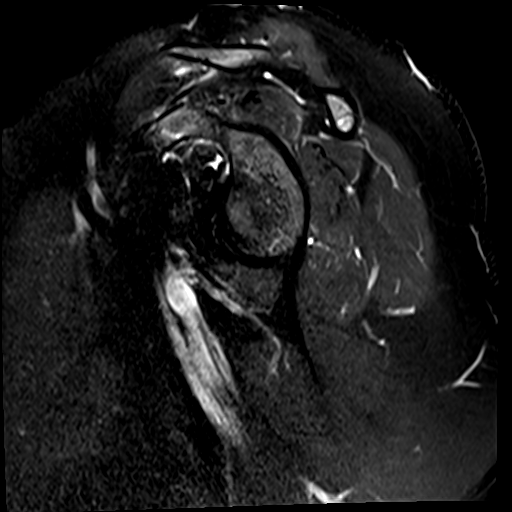
[im 13/22]
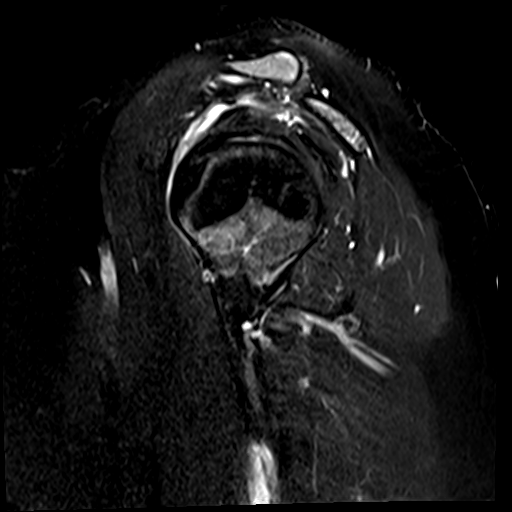
[im 16/22]
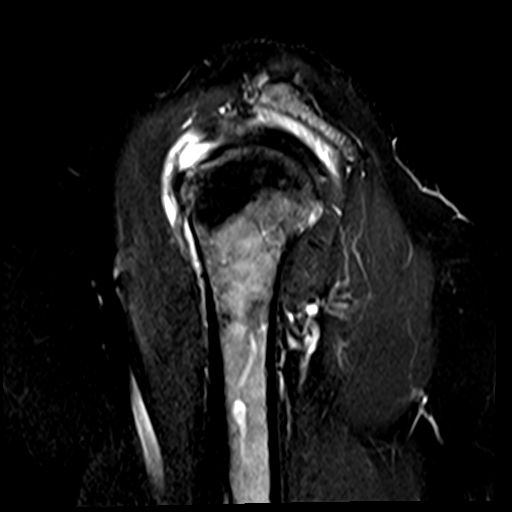
[im 19/22]
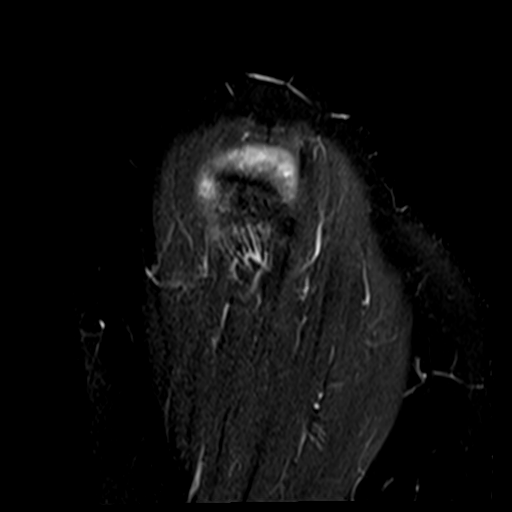
[im 22/22]
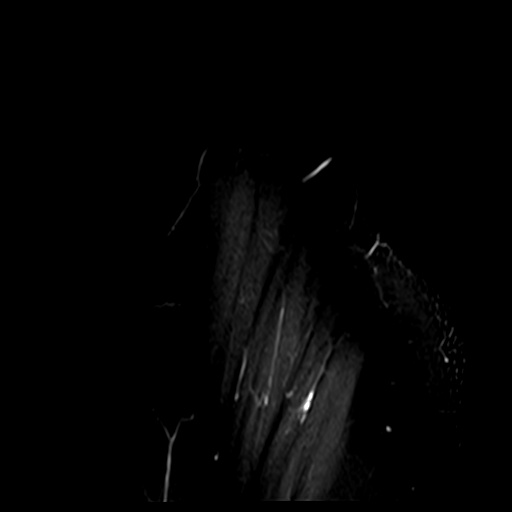

[Series 7: T1 · oblique · right · 4.0mm · 0.44mm/px · 2 of 22 slices shown]
[im 1/22]
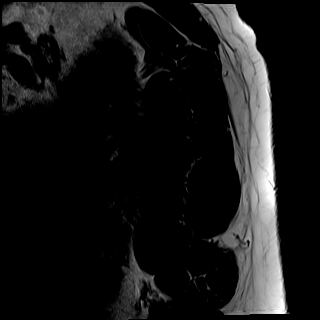
[im 4/22]
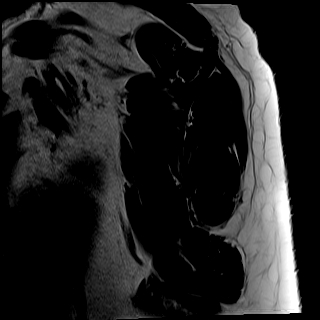

[34 of 40 positions shown; findings below may reference images not displayed]

FINDINGS: Rotator cuff: Full-thickness tear of the anterior supraspinatus
tendon with 6 mm of tendinous retraction. Tear measures
approximately 8 mm in AP dimension. Background of moderate
supraspinatus tendinosis. Infraspinatus, subscapularis, and teres
minor tendons within normal limits.

Muscles: Preserved bulk and signal intensity of the rotator cuff
musculature without edema, atrophy, or fatty infiltration.

Biceps long head:  Intact.

Acromioclavicular Joint: Minimal arthropathy of the AC joint. Small
volume subacromial-subdeltoid bursal fluid.

Glenohumeral Joint: No joint effusion. No chondral defect.

Labrum: Suboptimally evaluated in the absence of intra-articular
fluid or contrast. There is signal present within the
posterosuperior labrum suspicious for SLAP tear (series 5, images
10-11). No paralabral cyst.

Bones:  No marrow abnormality, fracture or dislocation.

Other: None.
IMPRESSION: 1. Supraspinatus tendinosis with full-thickness mildly retracted
tear anteriorly.
2. Findings suspicious for SLAP tear.
3. Mild subacromial-subdeltoid bursitis.

## 2021-11-06 ENCOUNTER — Ambulatory Visit: Payer: BC Managed Care – PPO | Admitting: Dermatology

## 2021-11-06 DIAGNOSIS — Z86018 Personal history of other benign neoplasm: Secondary | ICD-10-CM

## 2021-11-06 DIAGNOSIS — L719 Rosacea, unspecified: Secondary | ICD-10-CM | POA: Diagnosis not present

## 2021-11-06 DIAGNOSIS — L821 Other seborrheic keratosis: Secondary | ICD-10-CM

## 2021-11-06 DIAGNOSIS — D229 Melanocytic nevi, unspecified: Secondary | ICD-10-CM

## 2021-11-06 DIAGNOSIS — L820 Inflamed seborrheic keratosis: Secondary | ICD-10-CM | POA: Diagnosis not present

## 2021-11-06 DIAGNOSIS — L814 Other melanin hyperpigmentation: Secondary | ICD-10-CM

## 2021-11-06 DIAGNOSIS — L578 Other skin changes due to chronic exposure to nonionizing radiation: Secondary | ICD-10-CM

## 2021-11-06 DIAGNOSIS — D1800 Hemangioma unspecified site: Secondary | ICD-10-CM

## 2021-11-06 MED ORDER — IVERMECTIN 1 % EX CREA: 1.0000 | TOPICAL_CREAM | Freq: Every day | CUTANEOUS | 11 refills | Status: DC

## 2021-11-06 NOTE — Patient Instructions (Addendum)
Cryotherapy Aftercare  Wash gently with soap and water everyday.   Apply Vaseline and Band-Aid daily until healed.    Rosacea is a chronic progressive skin condition usually affecting the face of adults, causing redness and/or acne bumps. It is treatable but not curable. It sometimes affects the eyes (ocular rosacea) as well. It may respond to topical and/or systemic medication and can flare with stress, sun exposure, alcohol, exercise and some foods.  Daily application of broad spectrum spf 30+ sunscreen to face is recommended to reduce flares.   Seborrheic Keratosis  What causes seborrheic keratoses? Seborrheic keratoses are harmless, common skin growths that first appear during adult life.  As time goes by, more growths appear.  Some people may develop a large number of them.  Seborrheic keratoses appear on both covered and uncovered body parts.  They are not caused by sunlight.  The tendency to develop seborrheic keratoses can be inherited.  They vary in color from skin-colored to gray, brown, or even black.  They can be either smooth or have a rough, warty surface.   Seborrheic keratoses are superficial and look as if they were stuck on the skin.  Under the microscope this type of keratosis looks like layers upon layers of skin.  That is why at times the top layer may seem to fall off, but the rest of the growth remains and re-grows.    Treatment Seborrheic keratoses do not need to be treated, but can easily be removed in the office.  Seborrheic keratoses often cause symptoms when they rub on clothing or jewelry.  Lesions can be in the way of shaving.  If they become inflamed, they can cause itching, soreness, or burning.  Removal of a seborrheic keratosis can be accomplished by freezing, burning, or surgery. If any spot bleeds, scabs, or grows rapidly, please return to have it checked, as these can be an indication of a skin cancer.  Due to recent changes in healthcare laws, you may see  results of your pathology and/or laboratory studies on MyChart before the doctors have had a chance to review them. We understand that in some cases there may be results that are confusing or concerning to you. Please understand that not all results are received at the same time and often the doctors may need to interpret multiple results in order to provide you with the best plan of care or course of treatment. Therefore, we ask that you please give [place] 2 business days to thoroughly review all your results before contacting the office for clarification. Should we see a critical lab result, you will be contacted sooner.   If You Need Anything After Your Visit  If you have any questions or concerns for your doctor, please call our main line at [phone] and press option 4 to reach your doctor's medical assistant. If no one answers, please leave a voicemail as directed and we will return your call as soon as possible. Messages left after 4 pm will be answered the following business day.   You may also send [place] a message via [place]. We typically respond to MyChart messages within 1-2 business days.  For prescription refills, please ask your pharmacy to contact our office. Our fax number is [phone].  If you have an urgent issue when the clinic is closed that cannot wait until the next business day, you can page your doctor at the number below.    Please note that while we do our best to be available  for urgent issues outside of office hours, we are not available 24/7.   If you have an urgent issue and are unable to reach [place], you may choose to seek medical care at your doctor's office, retail clinic, urgent care center, or emergency room.  If you have a medical emergency, please immediately call 911 or go to the emergency department.  Pager Numbers  - Dr. Nehemiah Massed: [phone]  - Dr. Laurence Ferrari: [phone]  - Dr. Nicole Kindred: [phone]  In the event of inclement weather, please call our main  line at [phone] for an update on the status of any delays or closures.  Dermatology Medication Tips: Please keep the boxes that topical medications come in in order to help keep track of the instructions about where and how to use these. Pharmacies typically print the medication instructions only on the boxes and not directly on the medication tubes.   If your medication is too expensive, please contact our office at [phone] option 4 or send [place] a message through [place].   We are unable to tell what your co-pay for medications will be in advance as this is different depending on your insurance coverage. However, we may be able to find a substitute medication at lower cost or fill out paperwork to get insurance to cover a needed medication.   If a prior authorization is required to get your medication covered by your insurance company, please allow [place] 1-2 business days to complete this process.  Drug prices often vary depending on where the prescription is filled and some pharmacies may offer cheaper prices.  The website www.goodrx.com contains coupons for medications through different pharmacies. The prices here do not account for what the cost may be with help from insurance (it may be cheaper with your insurance), but the website can give you the price if you did not use any insurance.  - You can print the associated coupon and take it with your prescription to the pharmacy.  - You may also stop by our office during regular business hours and pick up a GoodRx coupon card.  - If you need your prescription sent electronically to a different pharmacy, notify our office through [facility] or by phone at [phone] option 4.     Si Usted Necesita Algo Despus de Su Visita  Tambin puede enviarnos un mensaje a travs de Pharmacist, community. Por lo general respondemos a los mensajes de MyChart en el transcurso de 1 a 2 das hbiles.  Para renovar recetas, por favor pida a su farmacia que  se ponga en contacto con nuestra oficina. Harland Dingwall de fax es [place] [phone].  Si tiene un asunto urgente cuando la clnica est cerrada y que no puede esperar hasta el siguiente da hbil, puede llamar/localizar a su doctor(a) al nmero que aparece a continuacin.   Por favor, tenga en cuenta que aunque hacemos todo lo posible para estar disponibles para asuntos urgentes fuera del horario de [place], no estamos disponibles las 24 horas del da, los 7 das de la [place].   Si tiene un problema urgente y no puede comunicarse con nosotros, puede optar por buscar atencin mdica  en el consultorio de su doctor(a), en una clnica privada, en un centro de atencin urgente o en una sala de emergencias.  Si tiene Engineering geologist, por favor llame inmediatamente al 911 o vaya a la sala de emergencias.  Nmeros de bper  - Dr. Nehemiah Massed: [phone]  - Dra. Moye: [phone]  - Dra. Nicole Kindred: [phone]  En caso de inclemencias del [place], por favor llame a nuestra lnea principal al [phone] para una actualizacin sobre el [place] de cualquier retraso o cierre.  Consejos para la medicacin en dermatologa: Por favor, guarde las cajas en las que vienen los medicamentos de uso tpico para ayudarle a seguir las instrucciones sobre dnde y cmo usarlos. Las farmacias generalmente imprimen las instrucciones del medicamento slo en las cajas y no directamente en los tubos del [place].   Si su medicamento es muy caro, por favor, pngase en contacto con Zigmund Daniel llamando al [phone] y presione la opcin 4 o envenos un mensaje a travs de Pharmacist, community.   No podemos decirle cul ser su copago por los medicamentos por adelantado ya que esto es diferente dependiendo de la cobertura de su seguro. Sin embargo, es posible que podamos encontrar un medicamento sustituto a Electrical engineer un formulario para que el seguro cubra el medicamento que se considera necesario.   Si se  requiere una autorizacin previa para que su compaa de seguros [place] su medicamento, por favor permtanos de 1 a 2 das hbiles para completar este proceso.  Los precios de los medicamentos varan con frecuencia dependiendo del Environmental consultant de dnde se surte la receta y alguna farmacias pueden ofrecer precios ms baratos.  El sitio web www.goodrx.com tiene cupones para medicamentos de Airline pilot. Los precios aqu no tienen en cuenta lo que podra costar con la ayuda del seguro (puede ser ms barato con su seguro), pero el sitio web puede darle el precio si no utiliz Research scientist (physical sciences).  - Puede imprimir el cupn correspondiente y llevarlo con su receta a la farmacia.  - Tambin puede pasar por nuestra oficina durante el horario de atencin regular y Charity fundraiser una tarjeta de cupones de GoodRx.  - Si necesita que su receta se enve electrnicamente a una farmacia diferente, informe a nuestra oficina a travs de MyChart de [place] o por telfono llamando al [phone] y presione la opcin 4.

## 2021-11-06 NOTE — Progress Notes (Signed)
Follow-Up Visit   Subjective  Karen Cummings is a 61 y.o. female who presents for the following: Total body skin exam (Hx of Dysplastic Nevus R buttock) and Rosacea (?, pt has had erythema on cheeks, has used Cortisone 10 qhs).  The patient presents for Total-Body Skin Exam (TBSE) for skin cancer screening and mole check.  The patient has spots, moles and lesions to be evaluated, some may be new or changing and the patient has concerns that these could be cancer.   The following portions of the chart were reviewed this encounter and updated as appropriate:       Review of Systems:  No other skin or systemic complaints except as noted in HPI or Assessment and Plan.  Objective  Well appearing patient in no apparent distress; mood and affect are within normal limits.  A full examination was performed including scalp, head, eyes, ears, nose, lips, neck, chest, axillae, abdomen, back, buttocks, bilateral upper extremities, bilateral lower extremities, hands, feet, fingers, toes, fingernails, and toenails. All findings within normal limits unless otherwise noted below.  R temporal hairline x 1 Stuck on waxy papule with erythema  face Erythema cheeks and nose    Assessment & Plan   Lentigines - Scattered tan macules - Due to sun exposure - Benign-appearing, observe - Recommend daily broad spectrum sunscreen SPF 30+ to sun-exposed areas, reapply every 2 hours as needed. - Call for any changes - upper back  Seborrheic Keratoses - Stuck-on, waxy, tan-brown papules and/or plaques  - Benign-appearing - Discussed benign etiology and prognosis. - Observe - Call for any changes - back, abdomen  Melanocytic Nevi - Tan-brown and/or pink-flesh-colored symmetric macules and papules - Benign appearing on exam today - Observation - Call clinic for new or changing moles - Recommend daily use of broad spectrum spf 30+ sunscreen to sun-exposed areas.  - Lower back small med dark  brown macules, arms, chest  Hemangiomas - Red papules - Discussed benign nature - Observe - Call for any changes - trunk, legs  Actinic Damage - Chronic condition, secondary to cumulative UV/sun exposure - diffuse scaly erythematous macules with underlying dyspigmentation - Recommend daily broad spectrum sunscreen SPF 30+ to sun-exposed areas, reapply every 2 hours as needed.  - Staying in the shade or wearing long sleeves, sun glasses (UVA+UVB protection) and wide brim hats (4-inch brim around the entire circumference of the hat) are also recommended for sun protection.  - Call for new or changing lesions. - chest  Skin cancer screening performed today.   History of Dysplastic Nevi - No evidence of recurrence today - Recommend regular full body skin exams - Recommend daily broad spectrum sunscreen SPF 30+ to sun-exposed areas, reapply every 2 hours as needed.  - Call if any new or changing lesions are noted between office visits  - R buttock  Inflamed seborrheic keratosis R temporal hairline x 1  Symptomatic, irritating, patient would like treated.   Destruction of lesion - R temporal hairline x 1  Destruction method: cryotherapy   Informed consent: discussed and consent obtained   Lesion destroyed using liquid nitrogen: Yes   Region frozen until ice ball extended beyond lesion: Yes   Outcome: patient tolerated procedure well with no complications   Post-procedure details: wound care instructions given   Additional details:  Prior to procedure, discussed risks of blister formation, small wound, skin dyspigmentation, or rare scar following cryotherapy. Recommend Vaseline ointment to treated areas while healing.   Rosacea face  Chronic and persistent condition with duration or expected duration over one year. Condition is bothersome/symptomatic for patient. Currently flared.   Rosacea is a chronic progressive skin condition usually affecting the face of adults, causing  redness and/or acne bumps. It is treatable but not curable. It sometimes affects the eyes (ocular rosacea) as well. It may respond to topical and/or systemic medication and can flare with stress, sun exposure, alcohol, exercise and some foods.  Daily application of broad spectrum spf 30+ sunscreen to face is recommended to reduce flares.  Start Soolantra cream qhs to face  Ivermectin (SOOLANTRA) 1 % CREA - face Apply 1 Application topically at bedtime. Qhs to face for Rosacea   Return in about 1 year (around 11/07/2022) for TBSE, Hx of Dysplastic nevi.  I, Othelia Pulling, RMA, am acting as scribe for Brendolyn Patty, MD .  Documentation: I have reviewed the above documentation for accuracy and completeness, and I agree with the above.  Brendolyn Patty MD

## 2022-01-04 ENCOUNTER — Other Ambulatory Visit: Payer: Self-pay | Admitting: Specialist

## 2022-01-04 DIAGNOSIS — R0602 Shortness of breath: Secondary | ICD-10-CM

## 2022-01-04 DIAGNOSIS — R053 Chronic cough: Secondary | ICD-10-CM

## 2022-01-11 ENCOUNTER — Ambulatory Visit
Admission: RE | Admit: 2022-01-11 | Discharge: 2022-01-11 | Disposition: A | Discharge status: Home / Self Care | Payer: BC Managed Care – PPO | Source: Ambulatory Visit | Attending: Specialist | Admitting: Specialist

## 2022-01-11 DIAGNOSIS — R053 Chronic cough: Secondary | ICD-10-CM | POA: Insufficient documentation

## 2022-01-11 DIAGNOSIS — R0602 Shortness of breath: Secondary | ICD-10-CM | POA: Insufficient documentation

## 2022-04-23 ENCOUNTER — Encounter: Payer: Self-pay | Admitting: *Deleted

## 2022-04-23 ENCOUNTER — Encounter
Admission: RE | Disposition: A | Discharge status: Home / Self Care | Payer: Self-pay | Source: Home / Self Care | Attending: Gastroenterology

## 2022-04-23 ENCOUNTER — Ambulatory Visit
Admission: RE | Admit: 2022-04-23 | Discharge: 2022-04-23 | Disposition: A | Discharge status: Home / Self Care | Payer: BC Managed Care – PPO | Attending: Gastroenterology | Admitting: Gastroenterology

## 2022-04-23 ENCOUNTER — Ambulatory Visit: Payer: BC Managed Care – PPO | Admitting: Anesthesiology

## 2022-04-23 DIAGNOSIS — Z98891 History of uterine scar from previous surgery: Secondary | ICD-10-CM | POA: Insufficient documentation

## 2022-04-23 DIAGNOSIS — Z79620 Long term (current) use of immunosuppressive biologic: Secondary | ICD-10-CM | POA: Insufficient documentation

## 2022-04-23 DIAGNOSIS — K219 Gastro-esophageal reflux disease without esophagitis: Secondary | ICD-10-CM | POA: Diagnosis not present

## 2022-04-23 DIAGNOSIS — M069 Rheumatoid arthritis, unspecified: Secondary | ICD-10-CM | POA: Insufficient documentation

## 2022-04-23 DIAGNOSIS — K640 First degree hemorrhoids: Secondary | ICD-10-CM | POA: Diagnosis not present

## 2022-04-23 DIAGNOSIS — I10 Essential (primary) hypertension: Secondary | ICD-10-CM | POA: Insufficient documentation

## 2022-04-23 DIAGNOSIS — Z9049 Acquired absence of other specified parts of digestive tract: Secondary | ICD-10-CM | POA: Insufficient documentation

## 2022-04-23 DIAGNOSIS — Z1211 Encounter for screening for malignant neoplasm of colon: Secondary | ICD-10-CM | POA: Diagnosis not present

## 2022-04-23 HISTORY — PX: COLONOSCOPY WITH PROPOFOL: SHX5780

## 2022-04-23 SURGERY — COLONOSCOPY WITH PROPOFOL
Anesthesia: General

## 2022-04-23 MED ORDER — PROPOFOL 500 MG/50ML IV EMUL
INTRAVENOUS | Status: DC | PRN
  Administered 2022-04-23: 125 ug/kg/min via INTRAVENOUS

## 2022-04-23 MED ORDER — LIDOCAINE HCL (CARDIAC) PF 100 MG/5ML IV SOSY
PREFILLED_SYRINGE | INTRAVENOUS | Status: DC | PRN
  Administered 2022-04-23: 20 mg via INTRAVENOUS

## 2022-04-23 MED ORDER — PROPOFOL 10 MG/ML IV BOLUS
INTRAVENOUS | Status: DC | PRN
  Administered 2022-04-23: 40 mg via INTRAVENOUS
  Administered 2022-04-23: 80 mg via INTRAVENOUS
  Administered 2022-04-23: 40 mg via INTRAVENOUS

## 2022-04-23 MED ORDER — SODIUM CHLORIDE 0.9 % IV SOLN: INTRAVENOUS | Status: DC

## 2022-04-23 NOTE — Anesthesia Preprocedure Evaluation (Addendum)
Anesthesia Evaluation  Patient identified by MRN, date of birth, ID band Patient awake    Reviewed: Allergy & Precautions, NPO status , Patient's Chart, lab work & pertinent test results  History of Anesthesia Complications Negative for: history of anesthetic complications  Airway Mallampati: III  TM Distance: >3 FB Neck ROM: full    Dental  (+) Chipped   Pulmonary neg pulmonary ROS, neg shortness of breath   Pulmonary exam normal        Cardiovascular Exercise Tolerance: Good hypertension, (-) angina (-) Past MI and (-) DOE Normal cardiovascular exam     Neuro/Psych  PSYCHIATRIC DISORDERS      negative neurological ROS     GI/Hepatic Neg liver ROS,GERD  Medicated and Controlled,,  Endo/Other  negative endocrine ROS    Renal/GU negative Renal ROS  negative genitourinary   Musculoskeletal   Abdominal   Peds  Hematology negative hematology ROS (+)   Anesthesia Other Findings Past Medical History: No date: Arthritis No date: GERD (gastroesophageal reflux disease) No date: Hypertension  Past Surgical History: No date: CESAREAN SECTION     Comment:  x 3 No date: CHOLECYSTECTOMY 06/09/2020: SHOULDER ARTHROSCOPY WITH SUBACROMIAL DECOMPRESSION,  ROTATOR CUFF REPAIR AND BICEP TENDON REPAIR; Right     Comment:  Procedure: RIGHT SHOULDER ARTHROSCOPY WITH DEBRIDEMENT,               DECOMPRESSION, ROTATOR CUFF REPAIR, AND  BICEPS               TENODESIS;  Surgeon: Corky Mull, MD;  Location: [facility]               ORS;  Service: Orthopedics;  Laterality: Right;  BMI    Body Mass Index: 26.39 kg/m      Reproductive/Obstetrics negative OB ROS                             Anesthesia Physical Anesthesia Plan  ASA: 2  Anesthesia Plan: General   Post-op Pain Management: GA combined w/ Regional for post-op pain   Induction: Intravenous  PONV Risk Score and Plan: Propofol infusion and  TIVA  Airway Management Planned: Natural Airway and Nasal Cannula  Additional Equipment:   Intra-op Plan:   Post-operative Plan:   Informed Consent: I have reviewed the patients History and Physical, chart, labs and discussed the procedure including the risks, benefits and alternatives for the proposed anesthesia with the patient or authorized representative who has indicated his/her understanding and acceptance.     Dental Advisory Given  Plan Discussed with: Anesthesiologist, CRNA and Surgeon  Anesthesia Plan Comments:         Anesthesia Quick Evaluation

## 2022-04-23 NOTE — H&P (Signed)
Outpatient short stay form Pre-procedure 04/23/2022  Lesly Rubenstein, MD  Primary Physician: Dion Body, MD  Reason for visit:  Screening colonoscopy  History of present illness:    62 y/o lady with RA on humira here for screening colonoscopy. Had normal colonoscopy 10 years ago. No blood thinners. No family history of GI malignancies. History of c-section.    Current Facility-Administered Medications:    0.9 %  sodium chloride infusion, , Intravenous, Continuous, Serenity Fortner, Hilton Cork, MD, Last Rate: 20 mL/hr at 04/23/22 1237, New Bag at 04/23/22 1237  Medications Prior to Admission  Medication Sig Dispense Refill Last Dose   Adalimumab (HUMIRA PEN) 40 MG/0.4ML PNKT Inject 40 mg into the skin every 14 (fourteen) days.   Past Week   albuterol (VENTOLIN HFA) 108 (90 Base) MCG/ACT inhaler Inhale 2 puffs into the lungs every 6 (six) hours as needed for shortness of breath or wheezing.   Past Month   Cholecalciferol (VITAMIN D) 50 MCG (2000 UT) CAPS Take 2,000 Units by mouth daily.   Past Week   citalopram (CELEXA) 10 MG tablet Take 10 mg by mouth daily.   04/22/2022   estradiol (ESTRACE) 0.1 MG/GM vaginal cream Place 1 Applicatorful vaginally daily as needed (meno).   04/22/2022   estradiol (ESTRACE) 1 MG tablet Take 1 mg by mouth daily.   [id number]   folic acid (FOLVITE) 1 MG tablet Take 1 mg by mouth daily.   Past Week   gentamicin cream (GARAMYCIN) 0.1 % Apply 1 application topically daily as needed (ulcers).   Past Month   ibuprofen (ADVIL) 800 MG tablet Take 800 mg by mouth every 8 (eight) hours as needed for moderate pain.   Past Week   Ivermectin (SOOLANTRA) 1 % CREA Apply 1 Application topically at bedtime. Qhs to face for Rosacea 45 g 11 Past Month   METHOTREXATE SODIUM IJ Inject 0.5 mLs into the skin once a week.   Past Week   omeprazole (PRILOSEC) 20 MG capsule Take 20 mg by mouth daily.   04/22/2022   traZODone (DESYREL) 50 MG tablet Take 125 mg by mouth at bedtime.    04/22/2022   triamcinolone (NASACORT) 55 MCG/ACT AERO nasal inhaler Place 2 sprays into the nose daily as needed (Allergies).   Past Week   acetaminophen (TYLENOL) 500 MG tablet Take 500 mg by mouth every 6 (six) hours as needed for moderate pain or mild pain. (Patient not taking: Reported on 04/23/2022)   Not Taking   hydrochlorothiazide (HYDRODIURIL) 12.5 MG tablet Take 12.5 mg by mouth daily.        No Known Allergies   Past Medical History:  Diagnosis Date   Arthritis    Atypical mole 11/07/2020   R buttock, moderate atypia   GERD (gastroesophageal reflux disease)    Hypertension     Review of systems:  Otherwise negative.    Physical Exam  Gen: Alert, oriented. Appears stated age.  HEENT: PERRLA. Lungs: No respiratory distress CV: RRR Abd: soft, benign, no masses Ext: No edema    Planned procedures: Proceed with colonoscopy. The patient understands the nature of the planned procedure, indications, risks, alternatives and potential complications including but not limited to bleeding, infection, perforation, damage to internal organs and possible oversedation/side effects from anesthesia. The patient agrees and gives consent to proceed.  Please refer to procedure notes for findings, recommendations and patient disposition/instructions.     Lesly Rubenstein, MD [facility]

## 2022-04-23 NOTE — Transfer of Care (Signed)
Immediate Anesthesia Transfer of Care Note  Patient: Karen Cummings  Procedure(s) Performed: COLONOSCOPY WITH PROPOFOL  Patient Location: PACU  Anesthesia Type:General  Level of Consciousness: drowsy  Airway & Oxygen Therapy: Patient Spontanous Breathing  Post-op Assessment: Report given to RN and Post -op Vital signs reviewed and stable  Post vital signs: Reviewed and stable  Last Vitals:  Vitals Value Taken Time  BP 110/70 04/23/22 1346  Temp 97.5   Pulse 67 04/23/22 1346  Resp 19 04/23/22 1346  SpO2 100 % 04/23/22 1346  Vitals shown include unvalidated device data.  Last Pain:  Vitals:   04/23/22 1224  TempSrc: Tympanic  PainSc: 0-No pain         Complications: No notable events documented.

## 2022-04-23 NOTE — Op Note (Signed)
[facility] Patient Name: Karen Cummings Procedure Date: 04/23/2022 1:22 PM MRN: [id number] Account #: [id number] Date of Birth: Apr 18, 1960 Admit Type: Outpatient Age: 62 Room: [facility] ENDO ROOM 3 Gender: Female Note Status: Finalized Instrument Name: Jasper Riling [id number] Procedure:             Colonoscopy Indications:           Screening for colorectal malignant neoplasm Providers:             Andrey Farmer MD, MD Referring MD:          Dion Body (Referring MD) Medicines:             Monitored Anesthesia Care Complications:         No immediate complications. Procedure:             Pre-Anesthesia Assessment:                        - Prior to the procedure, a History and Physical was                         performed, and patient medications and allergies were                         reviewed. The patient is competent. The risks and                         benefits of the procedure and the sedation options and                         risks were discussed with the patient. All questions                         were answered and informed consent was obtained.                         Patient identification and proposed procedure were                         verified by the physician, the nurse, the                         anesthesiologist, the anesthetist and the technician                         in the endoscopy suite. Mental Status Examination:                         alert and oriented. Airway Examination: normal                         oropharyngeal airway and neck mobility. Respiratory                         Examination: clear to auscultation. CV Examination:                         normal. Prophylactic Antibiotics: The patient does not  require prophylactic antibiotics. Prior                         Anticoagulants: The patient has taken no anticoagulant                         or antiplatelet agents. ASA Grade  Assessment: II - A                         patient with mild systemic disease. After reviewing                         the risks and benefits, the patient was deemed in                         satisfactory condition to undergo the procedure. The                         anesthesia plan was to use monitored anesthesia care                         (MAC). Immediately prior to administration of                         medications, the patient was re-assessed for adequacy                         to receive sedatives. The heart rate, respiratory                         rate, oxygen saturations, blood pressure, adequacy of                         pulmonary ventilation, and response to care were                         monitored throughout the procedure. The physical                         status of the patient was re-assessed after the                         procedure.                        After obtaining informed consent, the colonoscope was                         passed under direct vision. Throughout the procedure,                         the patient's blood pressure, pulse, and oxygen                         saturations were monitored continuously. The                         Colonoscope was introduced through the anus and  advanced to the the cecum, identified by appendiceal                         orifice and ileocecal valve. The colonoscopy was                         performed without difficulty. The patient tolerated                         the procedure well. The quality of the bowel                         preparation was good. The ileocecal valve, appendiceal                         orifice, and rectum were photographed. Findings:      The perianal and digital rectal examinations were normal.      Internal hemorrhoids were found during retroflexion. The hemorrhoids       were Grade I (internal hemorrhoids that do not prolapse).      The exam was otherwise  without abnormality on direct and retroflexion       views. Impression:            - Internal hemorrhoids.                        - The examination was otherwise normal on direct and                         retroflexion views.                        - No specimens collected. Recommendation:        - Discharge patient to home.                        - Resume previous diet.                        - Continue present medications.                        - Repeat colonoscopy in 10 years for screening                         purposes.                        - Return to referring physician as previously                         scheduled. Procedure Code(s):     --- Professional ---                        O4599, Colorectal cancer screening; colonoscopy on                         individual not meeting criteria for high risk Diagnosis Code(s):     --- Professional ---  Z12.11, Encounter for screening for malignant neoplasm                         of colon                        K64.0, First degree hemorrhoids CPT copyright 2022 [employer]. All rights reserved. The codes documented in this report are preliminary and upon coder review may  be revised to meet current compliance requirements. Andrey Farmer MD, MD 04/23/2022 1:57:38 PM Number of Addenda: 0 Note Initiated On: 04/23/2022 1:22 PM Scope Withdrawal Time: 0 hours 6 minutes 51 seconds  Total Procedure Duration: 0 hours 11 minutes 24 seconds  Estimated Blood Loss:  Estimated blood loss: none.      [facility]

## 2022-04-23 NOTE — Interval H&P Note (Signed)
History and Physical Interval Note:  04/23/2022 1:22 PM  Karen Cummings  has presented today for surgery, with the diagnosis of colon cancer screening.  The various methods of treatment have been discussed with the patient and family. After consideration of risks, benefits and other options for treatment, the patient has consented to  Procedure(s): COLONOSCOPY WITH PROPOFOL (N/A) as a surgical intervention.  The patient's history has been reviewed, patient examined, no change in status, stable for surgery.  I have reviewed the patient's chart and labs.  Questions were answered to the patient's satisfaction.     Lesly Rubenstein  Ok to proceed with colonoscopy

## 2022-04-24 ENCOUNTER — Encounter: Payer: Self-pay | Admitting: Gastroenterology

## 2022-05-03 NOTE — Anesthesia Postprocedure Evaluation (Signed)
Anesthesia Post Note  Patient: Karen Cummings  Procedure(s) Performed: COLONOSCOPY WITH PROPOFOL  Patient location during evaluation: Endoscopy Anesthesia Type: General Level of consciousness: awake and alert Pain management: pain level controlled Vital Signs Assessment: post-procedure vital signs reviewed and stable Respiratory status: spontaneous breathing, nonlabored ventilation, respiratory function stable and patient connected to nasal cannula oxygen Cardiovascular status: blood pressure returned to baseline and stable Postop Assessment: no apparent nausea or vomiting Anesthetic complications: no   No notable events documented.   Last Vitals:  Vitals:   04/23/22 1355 04/23/22 1405  BP:    Pulse: 66 (!) 56  Resp:    Temp:    SpO2: 95% 96%    Last Pain:  Vitals:   04/24/22 0730  TempSrc:   PainSc: 0-No pain                 Martha Clan

## 2022-09-19 DIAGNOSIS — K219 Gastro-esophageal reflux disease without esophagitis: Secondary | ICD-10-CM | POA: Insufficient documentation

## 2022-11-20 ENCOUNTER — Encounter: Payer: BC Managed Care – PPO | Admitting: Dermatology

## 2022-12-03 ENCOUNTER — Ambulatory Visit (INDEPENDENT_AMBULATORY_CARE_PROVIDER_SITE_OTHER): Payer: BC Managed Care – PPO | Admitting: Dermatology

## 2022-12-03 VITALS — BP 120/78 | HR 64

## 2022-12-03 DIAGNOSIS — L821 Other seborrheic keratosis: Secondary | ICD-10-CM

## 2022-12-03 DIAGNOSIS — D1801 Hemangioma of skin and subcutaneous tissue: Secondary | ICD-10-CM

## 2022-12-03 DIAGNOSIS — D229 Melanocytic nevi, unspecified: Secondary | ICD-10-CM

## 2022-12-03 DIAGNOSIS — W908XXA Exposure to other nonionizing radiation, initial encounter: Secondary | ICD-10-CM

## 2022-12-03 DIAGNOSIS — D2371 Other benign neoplasm of skin of right lower limb, including hip: Secondary | ICD-10-CM

## 2022-12-03 DIAGNOSIS — L578 Other skin changes due to chronic exposure to nonionizing radiation: Secondary | ICD-10-CM

## 2022-12-03 DIAGNOSIS — L719 Rosacea, unspecified: Secondary | ICD-10-CM | POA: Diagnosis not present

## 2022-12-03 DIAGNOSIS — Z1283 Encounter for screening for malignant neoplasm of skin: Secondary | ICD-10-CM | POA: Diagnosis not present

## 2022-12-03 DIAGNOSIS — D2261 Melanocytic nevi of right upper limb, including shoulder: Secondary | ICD-10-CM

## 2022-12-03 DIAGNOSIS — Z86018 Personal history of other benign neoplasm: Secondary | ICD-10-CM

## 2022-12-03 DIAGNOSIS — L814 Other melanin hyperpigmentation: Secondary | ICD-10-CM

## 2022-12-03 DIAGNOSIS — D239 Other benign neoplasm of skin, unspecified: Secondary | ICD-10-CM

## 2022-12-03 MED ORDER — IVERMECTIN 1 % EX CREA: 1.0000 | TOPICAL_CREAM | Freq: Every day | CUTANEOUS | 11 refills | Status: DC

## 2022-12-03 NOTE — Patient Instructions (Addendum)
Rosacea  What is rosacea? Rosacea (say: ro-zay-sha) is a common skin disease that usually begins as a trend of flushing or blushing easily.  As rosacea progresses, a persistent redness in the center of the face will develop and may gradually spread beyond the nose and cheeks to the forehead and chin.  In some cases, the ears, chest, and back could be affected.  Rosacea may appear as tiny blood vessels or small red bumps that occur in crops.  Frequently they can contain pus, and are called "pustules".  If the bumps do not contain pus, they are referred to as "papules".  Rarely, in prolonged, untreated cases of rosacea, the oil glands of the nose and cheeks may become permanently enlarged.  This is called rhinophyma, and is seen more frequently in men.  Signs and Risks In its beginning stages, rosacea tends to come and go, which makes it difficult to recognize.  It can start as intermittent flushing of the face.  Eventually, blood vessels may become permanently visible.  Pustules and papules can appear, but can be mistaken for adult acne.  People of all races, ages, genders and ethnic groups are at risk of developing rosacea.  However, it is more common in women (especially around menopause) and adults with fair skin between the ages of 73 and 37.  Treatment Dermatologists typically recommend a combination of treatments to effectively manage rosacea.  Treatment can improve symptoms and may stop the progression of the rosacea.  Treatment may involve both topical and oral medications.  The tetracycline antibiotics are often used for their anti-inflammatory effect; however, because of the possibility of developing antibiotic resistance, they should not be used long term at full dose.  For dilated blood vessels the options include electrodessication (uses electric current through a small needle), laser treatment, and cosmetics to hide the redness.   With all forms of treatment, improvement is a slow process, and  patients may not see any results for the first 3-4 weeks.  It is very important to avoid the sun and other triggers.  Patients must wear sunscreen daily.  Skin Care Instructions: Cleanse the skin with a mild soap such as CeraVe cleanser, Cetaphil cleanser, or Dove soap once or twice daily as needed. Moisturize with Eucerin Redness Relief Daily Perfecting Lotion (has a subtle green tint), CeraVe Moisturizing Cream, or Oil of Olay Daily Moisturizer with sunscreen every morning and/or night as recommended. Makeup should be "non-comedogenic" (won't clog pores) and be labeled "for sensitive skin". Good choices for cosmetics are: Neutrogena, Almay, and Physician's Formula.  Any product with a green tint tends to offset a red complexion. If your eyes are dry and irritated, use artificial tears 2-3 times per day and cleanse the eyelids daily with baby shampoo.  Have your eyes examined at least every 2 years.  Be sure to tell your eye doctor that you have rosacea. Alcoholic beverages tend to cause flushing of the skin, and may make rosacea worse. Always wear sunscreen, protect your skin from extreme hot and cold temperatures, and avoid spicy foods, hot drinks, and mechanical irritation such as rubbing, scrubbing, or massaging the face.  Avoid harsh skin cleansers, cleansing masks, astringents, and exfoliation. If a particular product burns or makes your face feel tight, then it is likely to flare your rosacea. If you are having difficulty finding a sunscreen that you can tolerate, you may try switching to a chemical-free sunscreen.  These are ones whose active ingredient is zinc oxide or titanium dioxide  only.  They should also be fragrance free, non-comedogenic, and labeled for sensitive skin. Rosacea triggers may vary from person to person.  There are a variety of foods that have been reported to trigger rosacea.  Some patients find that keeping a diary of what they were doing when they flared helps them avoid  triggers.   Melanoma ABCDEs  Melanoma is the most dangerous type of skin cancer, and is the leading cause of death from skin disease.  You are more likely to develop melanoma if you: Have light-colored skin, light-colored eyes, or red or blond hair Spend a lot of time in the sun Tan regularly, either outdoors or in a tanning bed Have had blistering sunburns, especially during childhood Have a close family member who has had a melanoma Have atypical moles or large birthmarks  Early detection of melanoma is key since treatment is typically straightforward and cure rates are extremely high if we catch it early.   The first sign of melanoma is often a change in a mole or a new dark spot.  The ABCDE system is a way of remembering the signs of melanoma.  A for asymmetry:  The two halves do not match. B for border:  The edges of the growth are irregular. C for color:  A mixture of colors are present instead of an even brown color. D for diameter:  Melanomas are usually (but not always) greater than 6mm - the size of a pencil eraser. E for evolution:  The spot keeps changing in size, shape, and color.  Please check your skin once per month between visits. You can use a small mirror in front and a large mirror behind you to keep an eye on the back side or your body.   If you see any new or changing lesions before your next follow-up, please call to schedule a visit.  Please continue daily skin protection including broad spectrum sunscreen SPF 30+ to sun-exposed areas, reapplying every 2 hours as needed when you're outdoors.   Staying in the shade or wearing long sleeves, sun glasses (UVA+UVB protection) and wide brim hats (4-inch brim around the entire circumference of the hat) are also recommended for sun protection.     Due to recent changes in healthcare laws, you may see results of your pathology and/or laboratory studies on MyChart before the doctors have had a chance to review them. We  understand that in some cases there may be results that are confusing or concerning to you. Please understand that not all results are received at the same time and often the doctors may need to interpret multiple results in order to provide you with the best plan of care or course of treatment. Therefore, we ask that you please give [place] 2 business days to thoroughly review all your results before contacting the office for clarification. Should we see a critical lab result, you will be contacted sooner.   If You Need Anything After Your Visit  If you have any questions or concerns for your doctor, please call our main line at [phone] and press option 4 to reach your doctor's medical assistant. If no one answers, please leave a voicemail as directed and we will return your call as soon as possible. Messages left after 4 pm will be answered the following business day.   You may also send [place] a message via MyChart. We typically respond to MyChart messages within 1-2 business days.  For prescription refills, please ask your pharmacy  to contact our office. Our fax number is [phone].  If you have an urgent issue when the clinic is closed that cannot wait until the next business day, you can page your doctor at the number below.    Please note that while we do our best to be available for urgent issues outside of office hours, we are not available 24/7.   If you have an urgent issue and are unable to reach [place], you may choose to seek medical care at your doctor's office, retail clinic, urgent care center, or emergency room.  If you have a medical emergency, please immediately call 911 or go to the emergency department.  Pager Numbers  - Dr. Gwen Pounds: [phone]  - Dr. Roseanne Reno: [phone]  - Dr. Katrinka Blazing: [phone]   In the event of inclement weather, please call our main line at [phone] for an update on the status of any delays or closures.  Dermatology Medication Tips: Please  keep the boxes that topical medications come in in order to help keep track of the instructions about where and how to use these. Pharmacies typically print the medication instructions only on the boxes and not directly on the medication tubes.   If your medication is too expensive, please contact our office at [phone] option 4 or send [place] a message through MyChart.   We are unable to tell what your co-pay for medications will be in advance as this is different depending on your insurance coverage. However, we may be able to find a substitute medication at lower cost or fill out paperwork to get insurance to cover a needed medication.   If a prior authorization is required to get your medication covered by your insurance company, please allow [place] 1-2 business days to complete this process.  Drug prices often vary depending on where the prescription is filled and some pharmacies may offer cheaper prices.  The website www.goodrx.com contains coupons for medications through different pharmacies. The prices here do not account for what the cost may be with help from insurance (it may be cheaper with your insurance), but the website can give you the price if you did not use any insurance.  - You can print the associated coupon and take it with your prescription to the pharmacy.  - You may also stop by our office during regular business hours and pick up a GoodRx coupon card.  - If you need your prescription sent electronically to a different pharmacy, notify our office through [facility] or by phone at [phone] option 4.     Si Usted Necesita Algo Despus de Su Visita  Tambin puede enviarnos un mensaje a travs de Clinical cytogeneticist. Por lo general respondemos a los mensajes de MyChart en el transcurso de 1 a 2 das hbiles.  Para renovar recetas, por favor pida a su farmacia que se ponga en contacto con nuestra oficina. Annie Sable de fax es [place] [phone].  Si tiene un asunto urgente  cuando la clnica est cerrada y que no puede esperar hasta el siguiente da hbil, puede llamar/localizar a su doctor(a) al nmero que aparece a continuacin.   Por favor, tenga en cuenta que aunque hacemos todo lo posible para estar disponibles para asuntos urgentes fuera del horario de [place], no estamos disponibles las 24 horas del da, los 7 [address]  [address] de la [place].   Si tiene un problema urgente y no puede comunicarse con nosotros, puede optar por buscar atencin mdica  en el consultorio de su doctor(a), en  una clnica privada, en un centro de atencin urgente o en una sala de emergencias.  Si tiene Engineer, drilling, por favor llame inmediatamente al 911 o vaya a la sala de emergencias.  Nmeros de bper  - Dr. Gwen Pounds: [phone]  - Dra. Roseanne Reno: [phone]  - Dr. Katrinka Blazing: [phone]   En caso de inclemencias del tiempo, por favor llame a Lacy Duverney principal al [phone] para una actualizacin sobre el [place] de cualquier retraso o cierre.  Consejos para la medicacin en dermatologa: Por favor, guarde las cajas en las que vienen los medicamentos de uso tpico para ayudarle a seguir las instrucciones sobre dnde y cmo usarlos. Las farmacias generalmente imprimen las instrucciones del medicamento slo en las cajas y no directamente en los tubos del [place].   Si su medicamento es muy caro, por favor, pngase en contacto con Rolm Gala llamando al [phone] y presione la opcin 4 o envenos un mensaje a travs de Clinical cytogeneticist.   No podemos decirle cul ser su copago por los medicamentos por adelantado ya que esto es diferente dependiendo de la cobertura de su seguro. Sin embargo, es posible que podamos encontrar un medicamento sustituto a Audiological scientist un formulario para que el seguro cubra el medicamento que se considera necesario.   Si se requiere una autorizacin previa para que su compaa de seguros [place] su medicamento, por favor permtanos de 1 a 2  das hbiles para completar [address]  Los precios de los medicamentos varan con frecuencia dependiendo del Environmental consultant de dnde se surte la receta y alguna farmacias pueden ofrecer precios ms baratos.  El sitio web www.goodrx.com tiene cupones para medicamentos de Health and safety inspector. Los precios aqu no tienen en cuenta lo que podra costar con la ayuda del seguro (puede ser ms barato con su seguro), pero el sitio web puede darle el precio si no utiliz Tourist information centre manager.  - Puede imprimir el cupn correspondiente y llevarlo con su receta a la farmacia.  - Tambin puede pasar por nuestra oficina durante el horario de atencin regular y Education officer, museum una tarjeta de cupones de GoodRx.  - Si necesita que su receta se enve electrnicamente a una farmacia diferente, informe a nuestra oficina a travs de MyChart de [place] o por telfono llamando al [phone] y presione la opcin 4.

## 2022-12-03 NOTE — Progress Notes (Signed)
Follow-Up Visit   Subjective  Karen Cummings is a 62 y.o. female who presents for the following: Skin Cancer Screening and Full Body Skin Exam  The patient presents for Total-Body Skin Exam (TBSE) for skin cancer screening and mole check. The patient has spots, moles and lesions to be evaluated, some may be new or changing and the patient may have concern these could be cancer.    The following portions of the chart were reviewed this encounter and updated as appropriate: medications, allergies, medical history  Review of Systems:  No other skin or systemic complaints except as noted in HPI or Assessment and Plan.  Objective  Well appearing patient in no apparent distress; mood and affect are within normal limits.  A full examination was performed including scalp, head, eyes, ears, nose, lips, neck, chest, axillae, abdomen, back, buttocks, bilateral upper extremities, bilateral lower extremities, hands, feet, fingers, toes, fingernails, and toenails. All findings within normal limits unless otherwise noted below.   Relevant physical exam findings are noted in the Assessment and Plan.    Assessment & Plan   SKIN CANCER SCREENING PERFORMED TODAY.  ACTINIC DAMAGE - Chronic condition, secondary to cumulative UV/sun exposure - diffuse scaly erythematous macules with underlying dyspigmentation - Recommend daily broad spectrum sunscreen SPF 30+ to sun-exposed areas, reapply every 2 hours as needed.  - Staying in the shade or wearing long sleeves, sun glasses (UVA+UVB protection) and wide brim hats (4-inch brim around the entire circumference of the hat) are also recommended for sun protection.  - Call for new or changing lesions.  LENTIGINES, SEBORRHEIC KERATOSES, HEMANGIOMAS - Benign normal skin lesions - Benign-appearing - Call for any changes  MELANOCYTIC NEVI - Tan-brown and/or pink-flesh-colored symmetric macules and papules - 1.0 mm med dark brown macule on the right  upper inner arm - Benign appearing on exam today - Observation - Call clinic for new or changing moles - Recommend daily use of broad spectrum spf 30+ sunscreen to sun-exposed areas.   DERMATOFIBROMA Exam: Firm pink/brown papulenodule with dimple sign on the right lower calf  Treatment Plan: A dermatofibroma is a benign growth possibly related to trauma, such as an insect bite, cut from shaving, or inflamed acne-type bump.  Treatment options to remove include shave or excision with resulting scar and risk of recurrence.  Since benign-appearing and not bothersome, will observe for now.   History of Dysplastic Nevus Right buttock, moderate (2022) - No evidence of recurrence today - Recommend regular full body skin exams - Recommend daily broad spectrum sunscreen SPF 30+ to sun-exposed areas, reapply every 2 hours as needed.  - Call if any new or changing lesions are noted between office visits  ROSACEA Exam Mild erythema of the glabella, nose; resolving inflammatory papule on the nasal tip.    Chronic and persistent condition with duration or expected duration over one year. Condition is improving with treatment but not currently at goal.   Rosacea is a chronic progressive skin condition usually affecting the face of adults, causing redness and/or acne bumps. It is treatable but not curable. It sometimes affects the eyes (ocular rosacea) as well. It may respond to topical and/or systemic medication and can flare with stress, sun exposure, alcohol, exercise, topical steroids (including hydrocortisone/cortisone 10) and some foods.  Daily application of broad spectrum spf 30+ sunscreen to face is recommended to reduce flares.  Patient denies grittiness of the eyes  Treatment Plan Continue Soolantra cream nightly to mid face dsp 45g 11yr  Rf. Continue spf 30+ sunscreen to face daily   Rosacea  Related Medications Ivermectin (SOOLANTRA) 1 % CREA Apply 1 Application topically at bedtime.  Qhs to face for Rosacea   Return in about 1 year (around 12/03/2023) for TBSE, Hx Dysplastic Nevus.  ICherlyn Labella, CMA, am acting as scribe for Willeen Niece, MD .   Documentation: I have reviewed the above documentation for accuracy and completeness, and I agree with the above.  Willeen Niece, MD

## 2022-12-05 ENCOUNTER — Other Ambulatory Visit: Payer: Self-pay | Admitting: Certified Nurse Midwife

## 2022-12-05 DIAGNOSIS — Z1231 Encounter for screening mammogram for malignant neoplasm of breast: Secondary | ICD-10-CM

## 2023-01-02 ENCOUNTER — Ambulatory Visit
Admission: RE | Admit: 2023-01-02 | Discharge: 2023-01-02 | Disposition: A | Discharge status: Home / Self Care | Payer: BC Managed Care – PPO | Source: Ambulatory Visit | Attending: Certified Nurse Midwife | Admitting: Certified Nurse Midwife

## 2023-01-02 DIAGNOSIS — Z1231 Encounter for screening mammogram for malignant neoplasm of breast: Secondary | ICD-10-CM | POA: Diagnosis present

## 2023-01-03 ENCOUNTER — Inpatient Hospital Stay
Admission: RE | Admit: 2023-01-03 | Discharge: 2023-01-03 | Disposition: A | Discharge status: Home / Self Care | Payer: Self-pay | Source: Ambulatory Visit | Attending: Certified Nurse Midwife | Admitting: Certified Nurse Midwife

## 2023-01-03 ENCOUNTER — Other Ambulatory Visit: Payer: Self-pay | Admitting: *Deleted

## 2023-01-03 DIAGNOSIS — Z1231 Encounter for screening mammogram for malignant neoplasm of breast: Secondary | ICD-10-CM

## 2023-09-02 ENCOUNTER — Ambulatory Visit: Payer: Self-pay | Admitting: Dermatology

## 2023-09-02 ENCOUNTER — Encounter: Payer: Self-pay | Admitting: Dermatology

## 2023-09-02 DIAGNOSIS — L821 Other seborrheic keratosis: Secondary | ICD-10-CM | POA: Diagnosis not present

## 2023-09-02 DIAGNOSIS — B079 Viral wart, unspecified: Secondary | ICD-10-CM

## 2023-09-02 DIAGNOSIS — M069 Rheumatoid arthritis, unspecified: Secondary | ICD-10-CM | POA: Insufficient documentation

## 2023-09-02 DIAGNOSIS — L820 Inflamed seborrheic keratosis: Secondary | ICD-10-CM

## 2023-09-02 DIAGNOSIS — M25559 Pain in unspecified hip: Secondary | ICD-10-CM | POA: Insufficient documentation

## 2023-09-02 NOTE — Patient Instructions (Signed)

## 2023-09-02 NOTE — Progress Notes (Addendum)
   Follow-Up Visit   Subjective  Karen Cummings is a 63 y.o. female who presents for the following: multiple irritated skin lesions on the R popliteal that are growing, and she cuts shaving. Pt concerned and would like checked today.   The following portions of the chart were reviewed this encounter and updated as appropriate: medications, allergies, medical history  Review of Systems:  No other skin or systemic complaints except as noted in HPI or Assessment and Plan.  Objective  Well appearing patient in no apparent distress; mood and affect are within normal limits.   A focused examination was performed of the following areas: the legs   Relevant exam findings are noted in the Assessment and Plan.  R popliteal x 5, R inf breast x 1 (6) Verrucous papules -- Discussed viral etiology and contagion.   Assessment & Plan  VIRAL WARTS, UNSPECIFIED TYPE (6) R popliteal x 5, R inf breast x 1 (6) Viral Wart (HPV) Counseling - vs ISK, symptomatic, irritating, patient would like treated.  Discussed viral / HPV (Human Papilloma Virus) etiology and risk of spread /infectivity to other areas of body as well as to other people.  Multiple treatments and methods may be required to clear warts and it is possible treatment may not be successful.  Treatment risks include discoloration; scarring and there is still potential for wart recurrence. Destruction of lesion - R popliteal x 5, R inf breast x 1 (6) Complexity: simple   Destruction method: cryotherapy   Informed consent: discussed and consent obtained   Timeout:  patient name, date of birth, surgical site, and procedure verified Lesion destroyed using liquid nitrogen: Yes   Region frozen until ice ball extended beyond lesion: Yes   Outcome: patient tolerated procedure well with no complications   Post-procedure details: wound care instructions given   SEBORRHEIC KERATOSES   SEBORRHEIC KERATOSES, INFLAMED    Return for appointment as  scheduled.  Karen Cummings, CMA, am acting as scribe for Boneta Sharps, MD .   Documentation: I have reviewed the above documentation for accuracy and completeness, and I agree with the above.  Boneta Sharps, MD

## 2023-12-10 ENCOUNTER — Other Ambulatory Visit: Payer: Self-pay | Admitting: Certified Nurse Midwife

## 2023-12-10 DIAGNOSIS — Z1231 Encounter for screening mammogram for malignant neoplasm of breast: Secondary | ICD-10-CM

## 2023-12-17 ENCOUNTER — Ambulatory Visit: Payer: BC Managed Care – PPO | Admitting: Dermatology

## 2023-12-17 DIAGNOSIS — L578 Other skin changes due to chronic exposure to nonionizing radiation: Secondary | ICD-10-CM | POA: Diagnosis not present

## 2023-12-17 DIAGNOSIS — D2261 Melanocytic nevi of right upper limb, including shoulder: Secondary | ICD-10-CM

## 2023-12-17 DIAGNOSIS — B079 Viral wart, unspecified: Secondary | ICD-10-CM

## 2023-12-17 DIAGNOSIS — Z86018 Personal history of other benign neoplasm: Secondary | ICD-10-CM

## 2023-12-17 DIAGNOSIS — L719 Rosacea, unspecified: Secondary | ICD-10-CM | POA: Diagnosis not present

## 2023-12-17 DIAGNOSIS — D239 Other benign neoplasm of skin, unspecified: Secondary | ICD-10-CM

## 2023-12-17 DIAGNOSIS — Z1283 Encounter for screening for malignant neoplasm of skin: Secondary | ICD-10-CM | POA: Diagnosis not present

## 2023-12-17 DIAGNOSIS — L989 Disorder of the skin and subcutaneous tissue, unspecified: Secondary | ICD-10-CM | POA: Diagnosis not present

## 2023-12-17 DIAGNOSIS — W908XXA Exposure to other nonionizing radiation, initial encounter: Secondary | ICD-10-CM

## 2023-12-17 DIAGNOSIS — D1801 Hemangioma of skin and subcutaneous tissue: Secondary | ICD-10-CM

## 2023-12-17 DIAGNOSIS — L309 Dermatitis, unspecified: Secondary | ICD-10-CM

## 2023-12-17 DIAGNOSIS — L821 Other seborrheic keratosis: Secondary | ICD-10-CM

## 2023-12-17 DIAGNOSIS — D225 Melanocytic nevi of trunk: Secondary | ICD-10-CM

## 2023-12-17 DIAGNOSIS — L814 Other melanin hyperpigmentation: Secondary | ICD-10-CM

## 2023-12-17 DIAGNOSIS — D2371 Other benign neoplasm of skin of right lower limb, including hip: Secondary | ICD-10-CM

## 2023-12-17 DIAGNOSIS — D229 Melanocytic nevi, unspecified: Secondary | ICD-10-CM

## 2023-12-17 MED ORDER — CLOBETASOL PROPIONATE 0.05 % EX SOLN: CUTANEOUS | 1 refills | Status: AC

## 2023-12-17 MED ORDER — IVERMECTIN 1 % EX CREA: 1.0000 | TOPICAL_CREAM | Freq: Every day | CUTANEOUS | 11 refills | Status: AC

## 2023-12-17 MED ORDER — ZORYVE 0.15 % EX CREA: 1.0000 | TOPICAL_CREAM | Freq: Every day | CUTANEOUS | 1 refills | Status: AC

## 2023-12-17 NOTE — Progress Notes (Signed)
 Follow-Up Visit   Subjective  Karen Cummings is a 63 y.o. female who presents for the following: Skin Cancer Screening and Full Body Skin Exam. 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  The patient presents for Total-Body Skin Exam (TBSE) for skin cancer screening and mole check. The patient has spots, moles and lesions to be evaluated, some may be new or changing. Patient was [place] with shingles [place]. Started in the left scalp, spreading to forehead and eye. Patient was treated, and is still treating with Valtrex 1000 MG, Pred Forte eye drops, TMC 0.1% cream, and acyclovir ointment. She is seeing eye dr every week. Rash is itchy.   The following portions of the chart were reviewed this encounter and updated as appropriate: medications, allergies, medical history  Review of Systems:  No other skin or systemic complaints except as noted in HPI or Assessment and Plan.  Objective  Well appearing patient in no apparent distress; mood and affect are within normal limits.  A full examination was performed including scalp, head, eyes, ears, nose, lips, neck, chest, axillae, abdomen, back, buttocks, bilateral upper extremities, bilateral lower extremities, hands, feet, fingers, toes, fingernails, and toenails. All findings within normal limits unless otherwise noted below.   Relevant physical exam findings are noted in the Assessment and Plan.  L thumb x 1, R calf x 1 (2) Verrucous papules -- Discussed viral etiology and contagion.   Assessment & Plan   SKIN CANCER SCREENING PERFORMED TODAY.  ACTINIC DAMAGE - Chronic condition, secondary to cumulative UV/sun exposure - diffuse scaly erythematous macules with underlying dyspigmentation - Recommend daily broad spectrum sunscreen SPF 30+ to sun-exposed areas, reapply every 2 hours as needed.  - Staying in the shade or wearing long sleeves, sun glasses (UVA+UVB protection) and wide brim hats  (4-inch brim around the entire circumference of the hat) are also recommended for sun protection.  - Call for new or changing lesions.  LENTIGINES, HEMANGIOMAS - Benign normal skin lesions - Benign-appearing - Call for any changes  SEBORRHEIC KERATOSIS - Stuck-on, waxy, tan-brown papules and/or plaques at left temple, ankles (vs warts) - pt defers cryotherapy today. - Benign-appearing - Discussed benign etiology and prognosis. - Observe - Call for any changes  MELANOCYTIC NEVI - Tan-brown and/or pink-flesh-colored symmetric macules and papules - 1.0 mm med dark brown macule right upper inner arm  - 2.0 mm med dark brown macule right spinal mid back - Other smaller med dark brown macules on the back - Benign appearing on exam today - Observation - Call clinic for new or changing moles - Recommend daily use of broad spectrum spf 30+ sunscreen to sun-exposed areas.   DERMATOFIBROMA Exam: Firm pink/brown papulenodule with dimple sign on the right lower calf   Treatment Plan: A dermatofibroma is a benign growth possibly related to trauma, such as an insect bite, cut from shaving, or inflamed acne-type bump.  Treatment options to remove include shave or excision with resulting scar and risk of recurrence.  Since benign-appearing and not bothersome, will observe for now.   History of Dysplastic Nevus Right buttock, moderate (2022) - No evidence of recurrence today - Recommend regular full body skin exams - Recommend daily broad spectrum sunscreen SPF 30+ to sun-exposed areas, reapply every 2 hours as needed.  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 Condition is improving with treatment but not currently at goal.    Rosacea is a chronic progressive skin condition usually affecting the face of adults, causing redness and/or  acne bumps. It is treatable but not curable. It sometimes affects the eyes (ocular rosacea) as well. It may respond to topical and/or systemic medication and can flare with stress, sun exposure, alcohol, exercise, topical steroids (including hydrocortisone/cortisone 10) and some foods.  Daily application of broad spectrum spf 30+ sunscreen to face is recommended to reduce flares.   Treatment Plan Continue Soolantra  cream nightly to mid face dsp 45g 12yr Rf. 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 Condition is symptomatic/ bothersome to patient. Not currently at goal.   Treatment Plan: Start Zoryve 0.15% cream to aa face and eyelid once a day as needed for itch.  Start Clobetasol solution 1-2 times a day to aa scalp prn itch. Avoid applying to face, groin, and axilla. Use as directed. Long-term use can cause thinning of the skin.  Topical steroids (such as triamcinolone , fluocinolone, fluocinonide, mometasone, clobetasol, halobetasol, betamethasone, hydrocortisone) can cause thinning and lightening of the skin if they are used for too long in the same area. Your physician has selected the right strength medicine for your problem and area affected on the body. Please use your medication only as directed by your physician to prevent side effects.    VIRAL WARTS, UNSPECIFIED TYPE (2) L thumb x 1, R calf x 1 (2) Viral Wart (HPV) Counseling  Discussed viral / HPV (Human Papilloma Virus) etiology and risk of spread /infectivity to other areas of body as well as to other people.  Multiple treatments and methods may be required to clear warts and it is possible treatment may not be successful.  Treatment risks include discoloration; scarring and there is still potential for wart  recurrence. Destruction of lesion - L thumb x 1, R calf x 1 (2)  Destruction method: cryotherapy   Informed consent: discussed and consent obtained   Lesion destroyed using liquid nitrogen: Yes   Region frozen until ice ball extended beyond lesion: Yes   Outcome: patient tolerated procedure well with no complications   Post-procedure details: wound care instructions given   Additional details:  Prior to procedure, discussed risks of blister formation, small wound, skin dyspigmentation, or rare scar following cryotherapy. Recommend Vaseline ointment to treated areas while healing.   ROSACEA   Related Medications Ivermectin  (SOOLANTRA ) 1 % CREA Apply 1 Application topically at bedtime. Qhs to face for Rosacea DERMATITIS   Related Medications Roflumilast (ZORYVE) 0.15 % CREA Apply 1 Application topically daily. Apply 2 grams twice daily to affected areas of skin on face, eyelid. clobetasol (TEMOVATE) 0.05 % external solution Apply 1-2 drops to affected area scalp once to twice daily as needed for itch. Avoid face. Return in about 1 year (around 12/16/2024) for TBSE, Hx Dysplastic Nevus.  IAndrea Kerns, CMA, am acting as scribe for Rexene Rattler, MD .   Documentation: I have reviewed the above documentation for accuracy and completeness, and I agree with the above.  Rexene Rattler, MD

## 2023-12-17 NOTE — Patient Instructions (Addendum)
 Your prescription (Zoryve) was sent to [facility] in [place]. A representative from [facility] will contact you within 3 business hours to verify your address and insurance information to schedule a free delivery. If for any reason you do not receive a phone call from them, please reach out to them. Their phone number is [phone] and their hours are Monday-Friday 9:00 am-5:00 pm.   Cryotherapy Aftercare  Wash gently with soap and water everyday.   Apply Vaseline and Band-Aid daily until healed.    Seborrheic Keratosis  What causes seborrheic keratoses? Seborrheic keratoses are harmless, common skin growths that first appear during adult life.  As time goes by, more growths appear.  Some people may develop a large number of them.  Seborrheic keratoses appear on both covered and uncovered body parts.  They are not caused by sunlight.  The tendency to develop seborrheic keratoses can be inherited.  They vary in color from skin-colored to gray, brown, or even black.  They can be either smooth or have a rough, warty surface.   Seborrheic keratoses are superficial and look as if they were stuck on the skin.  Under the microscope this type of keratosis looks like layers upon layers of skin.  That is why at times the top layer may seem to fall off, but the rest of the growth remains and re-grows.    Treatment Seborrheic keratoses do not need to be treated, but can easily be removed in the office.  Seborrheic keratoses often cause symptoms when they rub on clothing or jewelry.  Lesions can be in the way of shaving.  If they become inflamed, they can cause itching, soreness, or burning.  Removal of a seborrheic keratosis can be accomplished by freezing, burning, or surgery. If any spot bleeds, scabs, or grows rapidly, please return to have it checked, as these can be an indication of a skin cancer.   Due to recent changes in healthcare laws, you may see results of your pathology and/or  laboratory studies on MyChart before the doctors have had a chance to review them. We understand that in some cases there may be results that are confusing or concerning to you. Please understand that not all results are received at the same time and often the doctors may need to interpret multiple results in order to provide you with the best plan of care or course of treatment. Therefore, we ask that you please give us  2 business days to thoroughly review all your results before contacting the office for clarification. Should we see a critical lab result, you will be contacted sooner.   If You Need Anything After Your Visit  If you have any questions or concerns for your doctor, please call our main line at [phone] and press option 4 to reach your doctor's medical assistant. If no one answers, please leave a voicemail as directed and we will return your call as soon as possible. Messages left after 4 pm will be answered the following business day.   You may also send us  a message via MyChart. We typically respond to MyChart messages within 1-2 business days.  For prescription refills, please ask your pharmacy to contact our office. Our fax number is [phone].  If you have an urgent issue when the clinic is closed that cannot wait until the next business day, you can page your doctor at the number below.    Please note that while we do our best to be available for urgent issues  outside of office hours, we are not available 24/7.   If you have an urgent issue and are unable to reach us , you may choose to seek medical care at your doctor's office, retail clinic, urgent care center, or emergency room.  If you have a medical emergency, please immediately call 911 or go to the emergency department.  Pager Numbers  - Dr. Hester: [phone]  - Dr. Jackquline: [phone]  - Dr. Claudene: [phone]   - Dr. Raymund: [phone]  In the event of inclement weather, please call our main  line at [phone] for an update on the status of any delays or closures.  Dermatology Medication Tips: Please keep the boxes that topical medications come in in order to help keep track of the instructions about where and how to use these. Pharmacies typically print the medication instructions only on the boxes and not directly on the medication tubes.   If your medication is too expensive, please contact our office at [phone] option 4 or send us  a message through MyChart.   We are unable to tell what your co-pay for medications will be in advance as this is different depending on your insurance coverage. However, we may be able to find a substitute medication at lower cost or fill out paperwork to get insurance to cover a needed medication.   If a prior authorization is required to get your medication covered by your insurance company, please allow us  1-2 business days to complete this process.  Drug prices often vary depending on where the prescription is filled and some pharmacies may offer cheaper prices.  The website www.goodrx.com contains coupons for medications through different pharmacies. The prices here do not account for what the cost may be with help from insurance (it may be cheaper with your insurance), but the website can give you the price if you did not use any insurance.  - You can print the associated coupon and take it with your prescription to the pharmacy.  - You may also stop by our office during regular business hours and pick up a GoodRx coupon card.  - If you need your prescription sent electronically to a different pharmacy, notify our office through [facility] or by phone at [phone] option 4.     Si Usted Necesita Algo Despus de Su Visita  Tambin puede enviarnos un mensaje a travs de Clinical cytogeneticist. Por lo general respondemos a los mensajes de MyChart en el transcurso de 1 a 2 das hbiles.  Para renovar recetas, por favor pida a su farmacia que  se ponga en contacto con nuestra oficina. Randi lakes de fax es [place] [phone].  Si tiene un asunto urgente cuando la clnica est cerrada y que no puede esperar hasta el siguiente da hbil, puede llamar/localizar a su doctor(a) al nmero que aparece a continuacin.   Por favor, tenga en cuenta que aunque hacemos todo lo posible para estar disponibles para asuntos urgentes fuera del horario de [place], no estamos disponibles las 24 horas del da, los 7 [address]  [address] de la [place].   Si tiene un problema urgente y no puede comunicarse con nosotros, puede optar por buscar atencin mdica  en el consultorio de su doctor(a), en una clnica privada, en un centro de atencin urgente o en una sala de emergencias.  Si tiene Engineer, drilling, por favor llame inmediatamente al 911 o vaya a la sala de emergencias.  Nmeros de bper  - Dr. Hester: [phone]  - Dra. Jackquline: [phone]  -  Dr. Claudene: [phone]  - Dra. Kitts: [phone]  En caso de inclemencias del [place], por favor llame a nuestra lnea principal al [phone] para una actualizacin sobre el estado de cualquier retraso o cierre.  Consejos para la medicacin en dermatologa: Por favor, guarde las cajas en las que vienen los medicamentos de uso tpico para ayudarle a seguir las instrucciones sobre dnde y cmo usarlos. Las farmacias generalmente imprimen las instrucciones del medicamento slo en las cajas y no directamente en los tubos del [place].   Si su medicamento es muy caro, por favor, pngase en contacto con landry rieger llamando al [phone] y presione la opcin 4 o envenos un mensaje a travs de Clinical cytogeneticist.   No podemos decirle cul ser su copago por los medicamentos por adelantado ya que esto es diferente dependiendo de la cobertura de su seguro. Sin embargo, es posible que podamos encontrar un medicamento sustituto a Audiological scientist un formulario para que el seguro cubra el medicamento que se  considera necesario.   Si se requiere una autorizacin previa para que su compaa de seguros malta su medicamento, por favor permtanos de 1 a 2 das hbiles para completar este proceso.  Los precios de los medicamentos varan con frecuencia dependiendo del Environmental consultant de dnde se surte la receta y alguna farmacias pueden ofrecer precios ms baratos.  El sitio web www.goodrx.com tiene cupones para medicamentos de Health and safety inspector. Los precios aqu no tienen en cuenta lo que podra costar con la ayuda del seguro (puede ser ms barato con su seguro), pero el sitio web puede darle el precio si no utiliz Tourist information centre manager.  - Puede imprimir el cupn correspondiente y llevarlo con su receta a la farmacia.  - Tambin puede pasar por nuestra oficina durante el horario de atencin regular y Education officer, museum una tarjeta de cupones de GoodRx.  - Si necesita que su receta se enve electrnicamente a una farmacia diferente, informe a nuestra oficina a travs de MyChart de [place] o por telfono llamando al [phone] y presione la opcin 4.

## 2024-01-16 ENCOUNTER — Encounter

## 2024-02-20 ENCOUNTER — Ambulatory Visit
Admission: RE | Admit: 2024-02-20 | Discharge: 2024-02-20 | Disposition: A | Discharge status: Home / Self Care | Source: Ambulatory Visit | Attending: Certified Nurse Midwife | Admitting: Certified Nurse Midwife

## 2024-02-20 DIAGNOSIS — Z1231 Encounter for screening mammogram for malignant neoplasm of breast: Secondary | ICD-10-CM | POA: Diagnosis present

## 2024-12-22 ENCOUNTER — Encounter: Admitting: Dermatology
